# Patient Record
Sex: Female | Born: 1950 | Race: Black or African American | Hispanic: No | Marital: Single | Smoking: Never smoker
Health system: Southern US, Community
[De-identification: ages and names within clinical notes are randomized; demographics above are authoritative.]

## PROBLEM LIST (undated history)

## (undated) DIAGNOSIS — I5189 Other ill-defined heart diseases: Secondary | ICD-10-CM

## (undated) DIAGNOSIS — I471 Supraventricular tachycardia, unspecified: Secondary | ICD-10-CM

## (undated) DIAGNOSIS — M1990 Unspecified osteoarthritis, unspecified site: Secondary | ICD-10-CM

## (undated) DIAGNOSIS — I10 Essential (primary) hypertension: Secondary | ICD-10-CM

## (undated) HISTORY — PX: ABDOMINAL HYSTERECTOMY: SHX81

## (undated) HISTORY — DX: Other ill-defined heart diseases: I51.89

## (undated) HISTORY — DX: Supraventricular tachycardia: I47.1

## (undated) HISTORY — DX: Supraventricular tachycardia, unspecified: I47.10

---

## 2009-01-06 ENCOUNTER — Observation Stay: Payer: Self-pay | Admitting: Internal Medicine

## 2009-01-06 IMAGING — US US CAROTID DUPLEX BILAT
1 series · 17 of 24 positions shown · non-contrast
Comparison: none

REASON FOR EXAM: presyncope
COMMENTS:

[Series 1: us carotid duplex bilat · 17 of 70 slices shown]
[im 1/70]
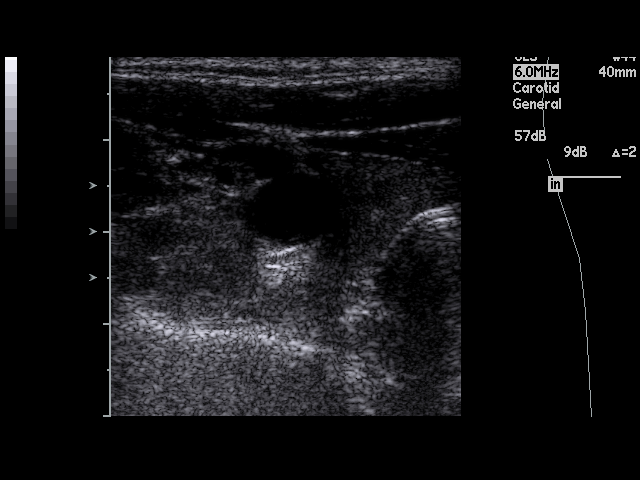
[im 7/70]
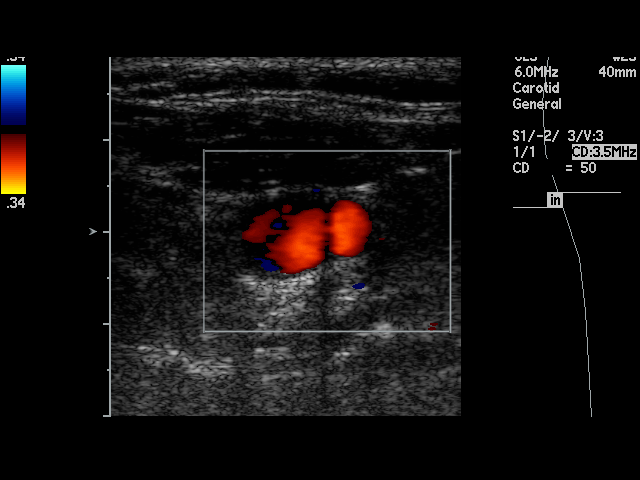
[im 10/70]
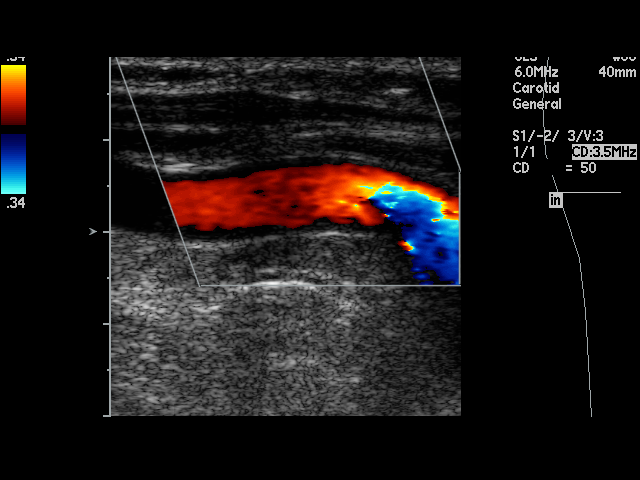
[im 13/70]
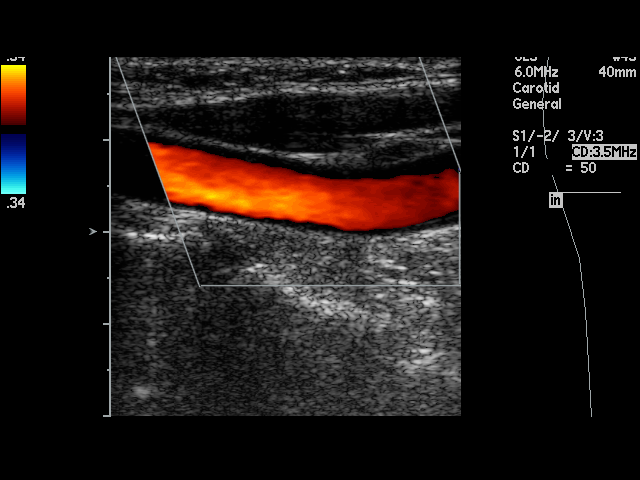
[im 19/70]
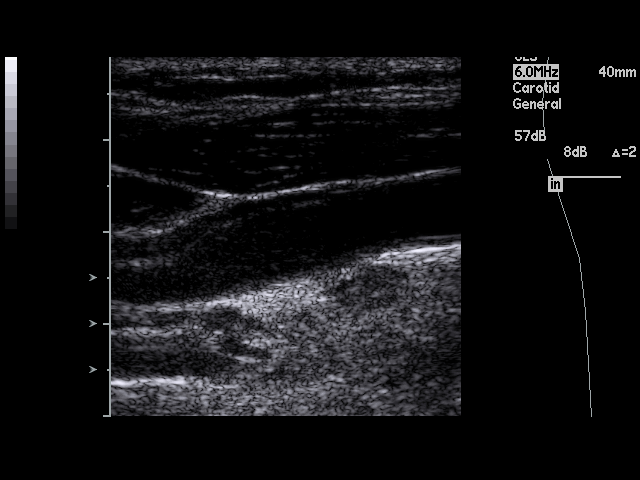
[im 22/70]
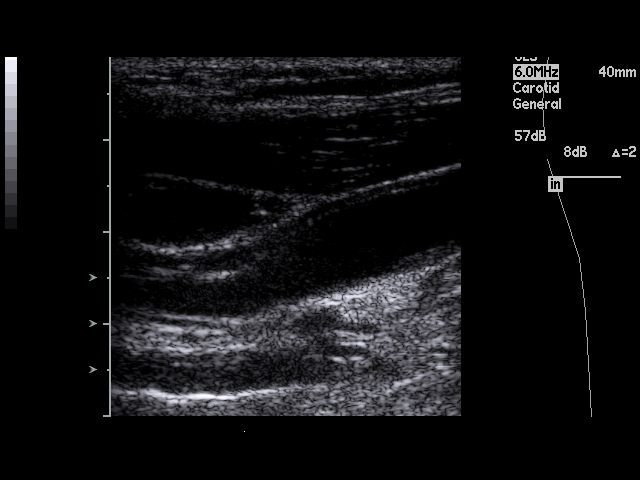
[im 28/70]
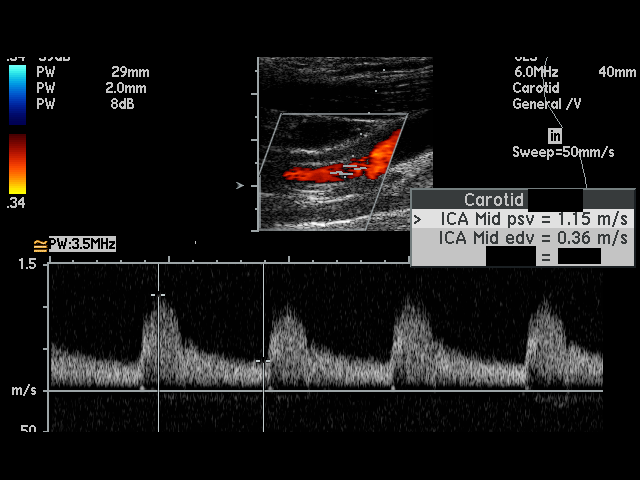
[im 31/70]
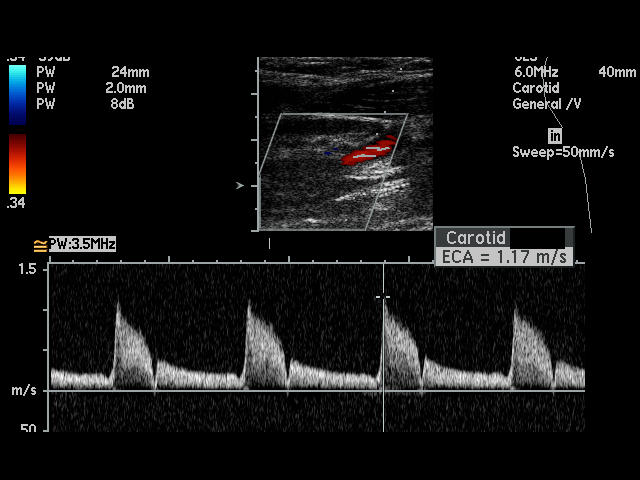
[im 37/70]
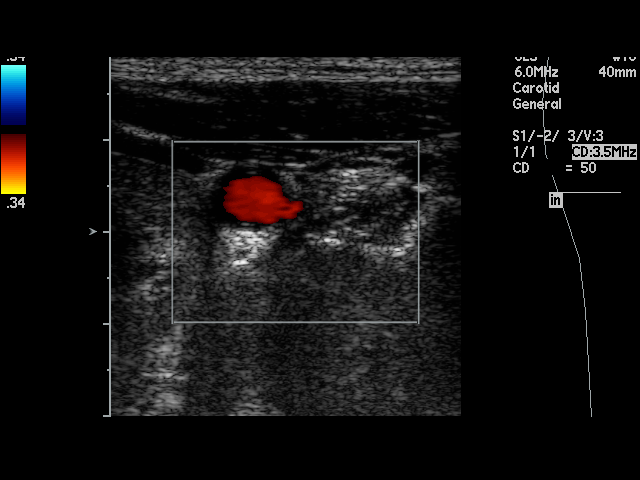
[im 40/70]
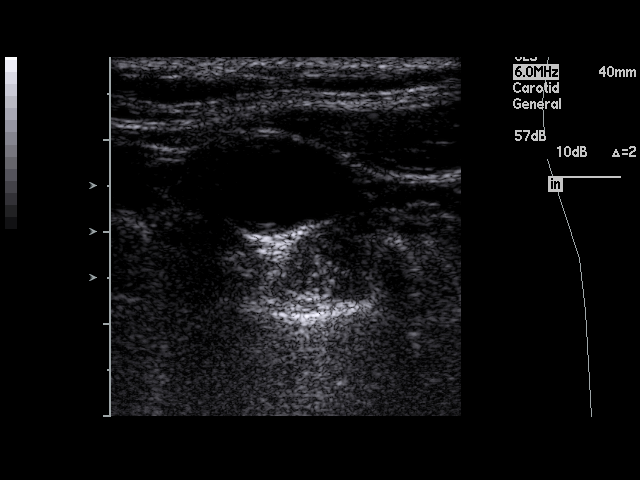
[im 43/70]
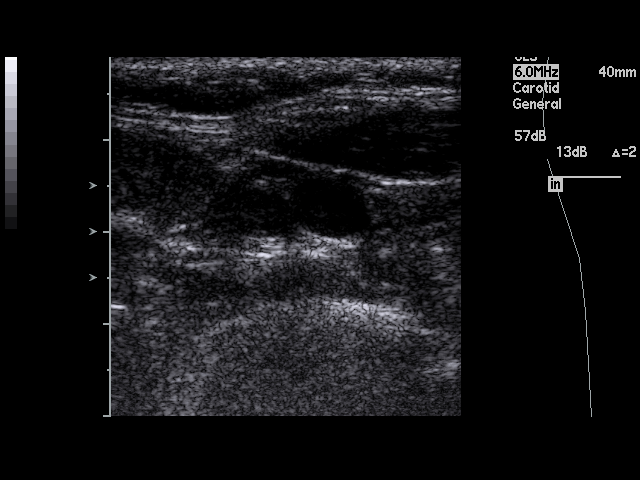
[im 49/70]
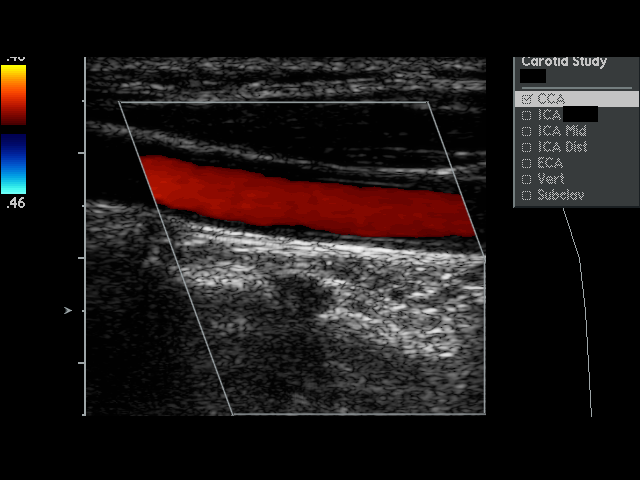
[im 52/70]
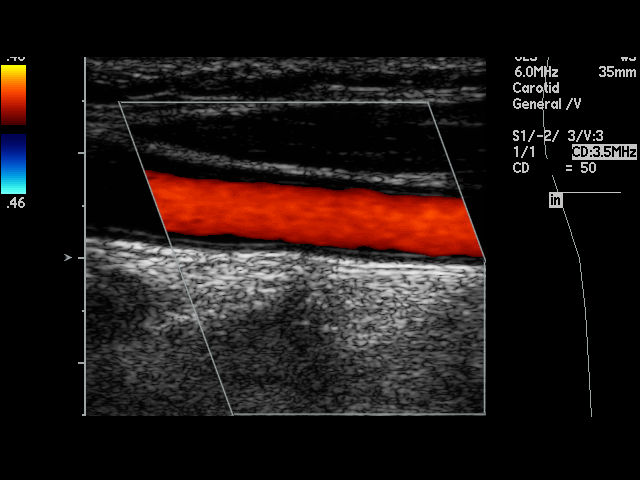
[im 58/70]
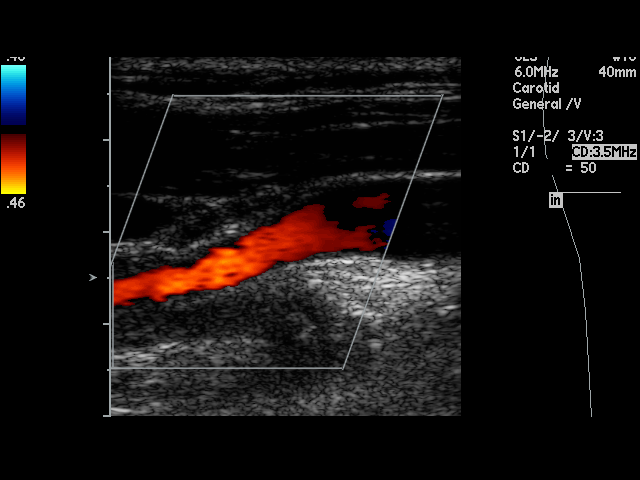
[im 61/70]
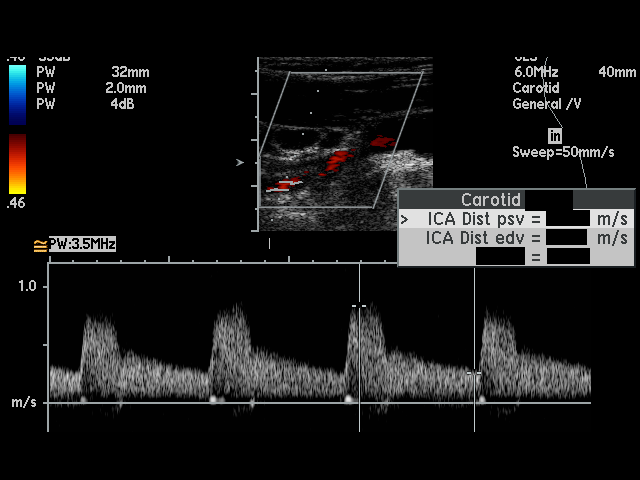
[im 64/70]
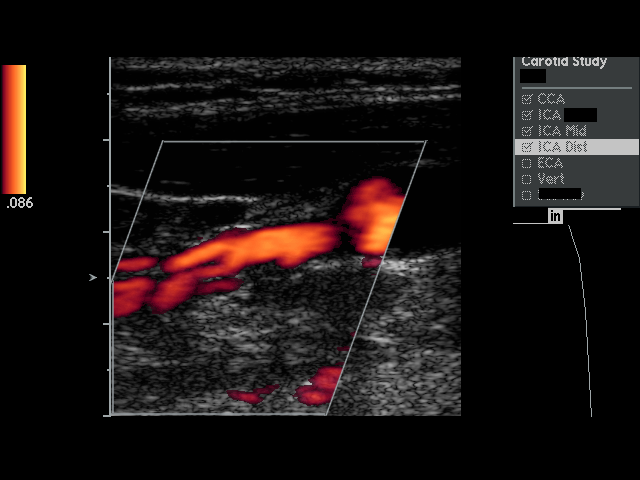
[im 70/70]
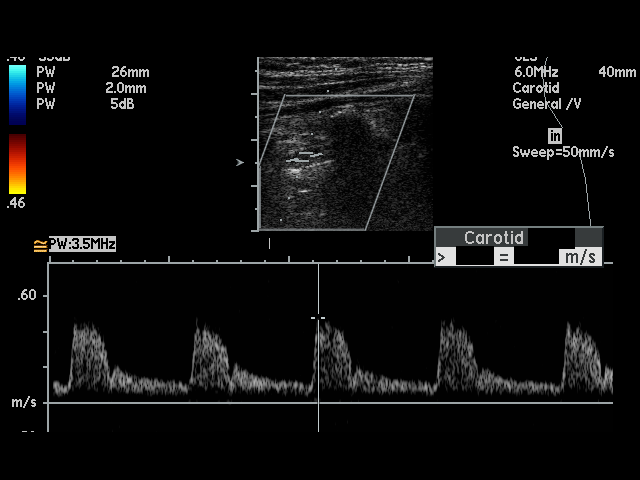

[17 of 24 positions shown; findings below may reference images not displayed]

PROCEDURE:     US  - US CAROTID DOPPLER BILATERAL  - [DATE] [DATE]

RESULT:     Grayscale, duplex color flow and spectral waveform imaging was
performed of the right and left carotid systems.

Visual evaluation of the right carotid system demonstrates smooth symmetric
plaque within the carotid bulb demonstrating less than 50% stenosis. No
further evidence of plaque formation is identified within the right or left
carotid systems. Spectral waveform imaging and color flow is unremarkable
within the right and left carotid systems.

Antegrade flow is identified within the right and left vertebral arteries.

ICA:CCA ratios:

RIGHT:

LEFT:
IMPRESSION: No sonographic evidence of hemodynamically significant
stenosis within the right or left carotid systems.

## 2009-01-06 IMAGING — CR DG CHEST 2V
1 series · 2 of 2 positions shown · non-contrast
Comparison: none

REASON FOR EXAM: chest pain
COMMENTS:

PROCEDURE:     DXR - DXR CHEST PA (OR AP) AND LATERAL  - [DATE]  [DATE]
RESULT:     The lungs are clear. The cardiac silhouette and visualized bony
skeleton are unremarkable.

[Series 1: view not recorded · 0.17mm/px · 2 of 2 slices shown]
[im 1/2]
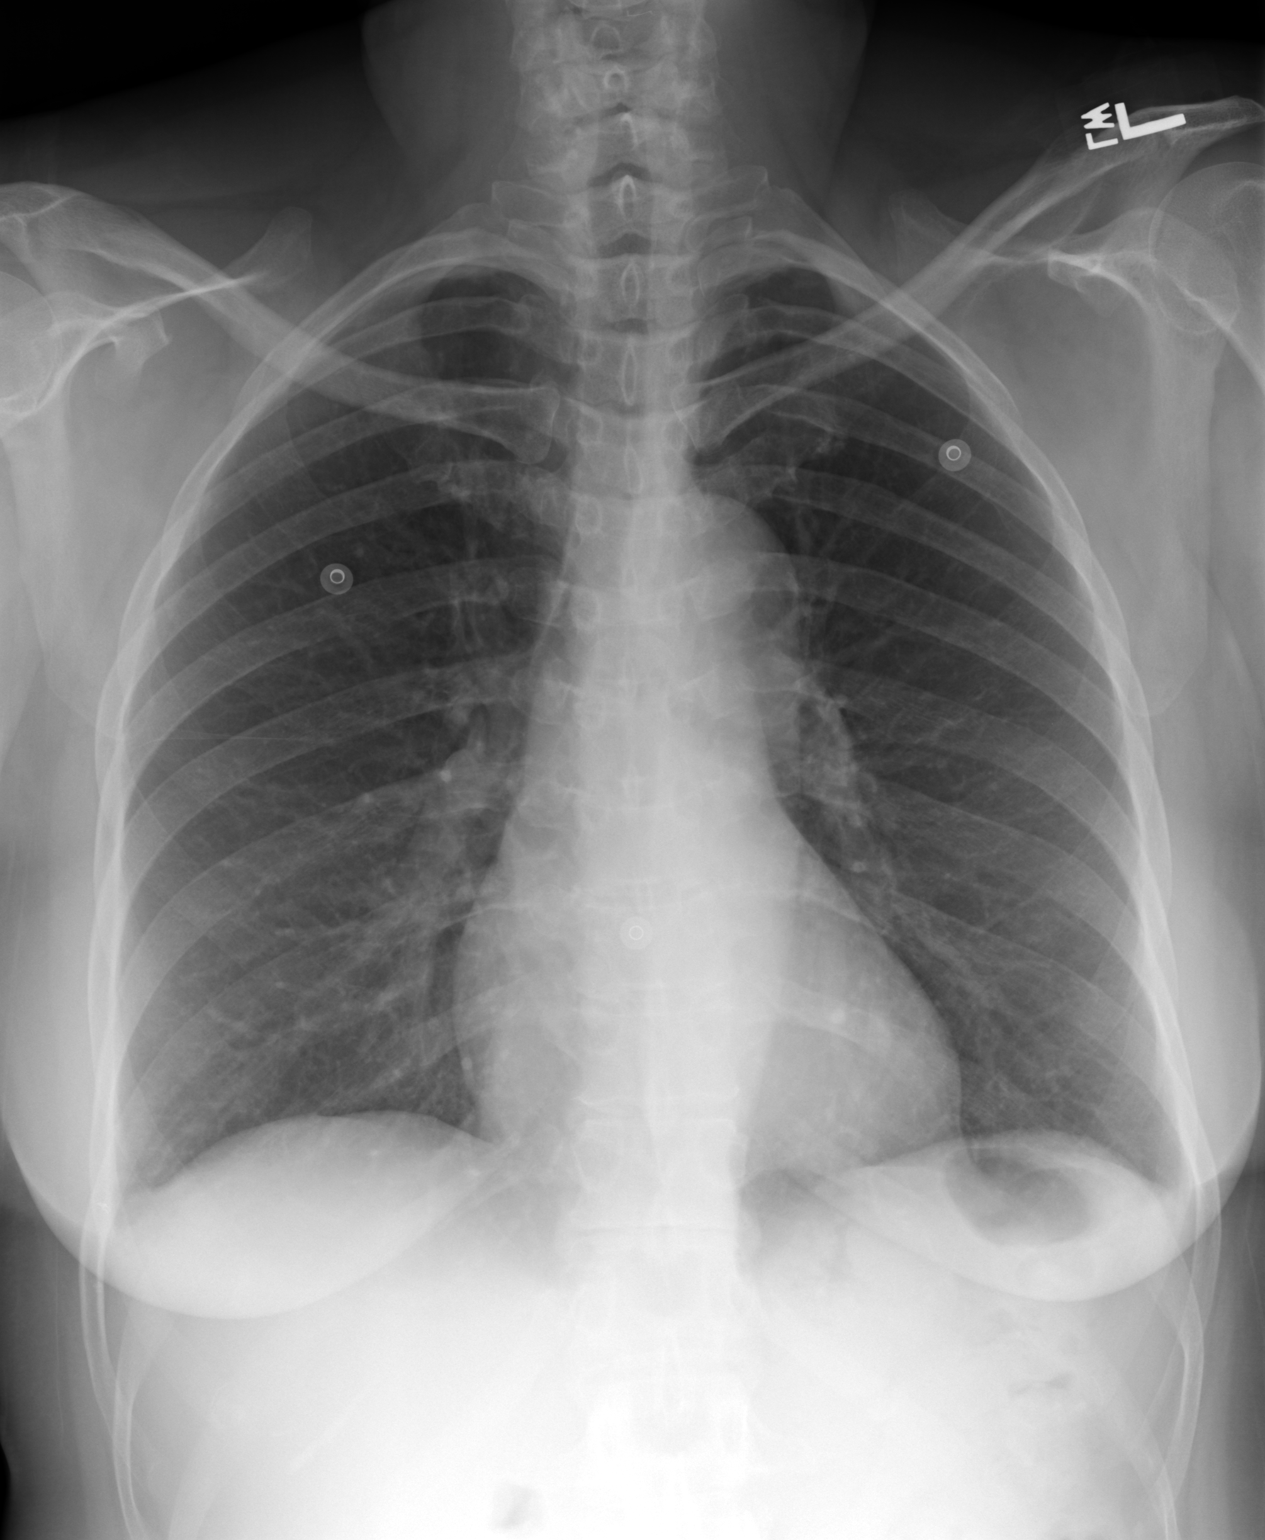
[im 2/2]
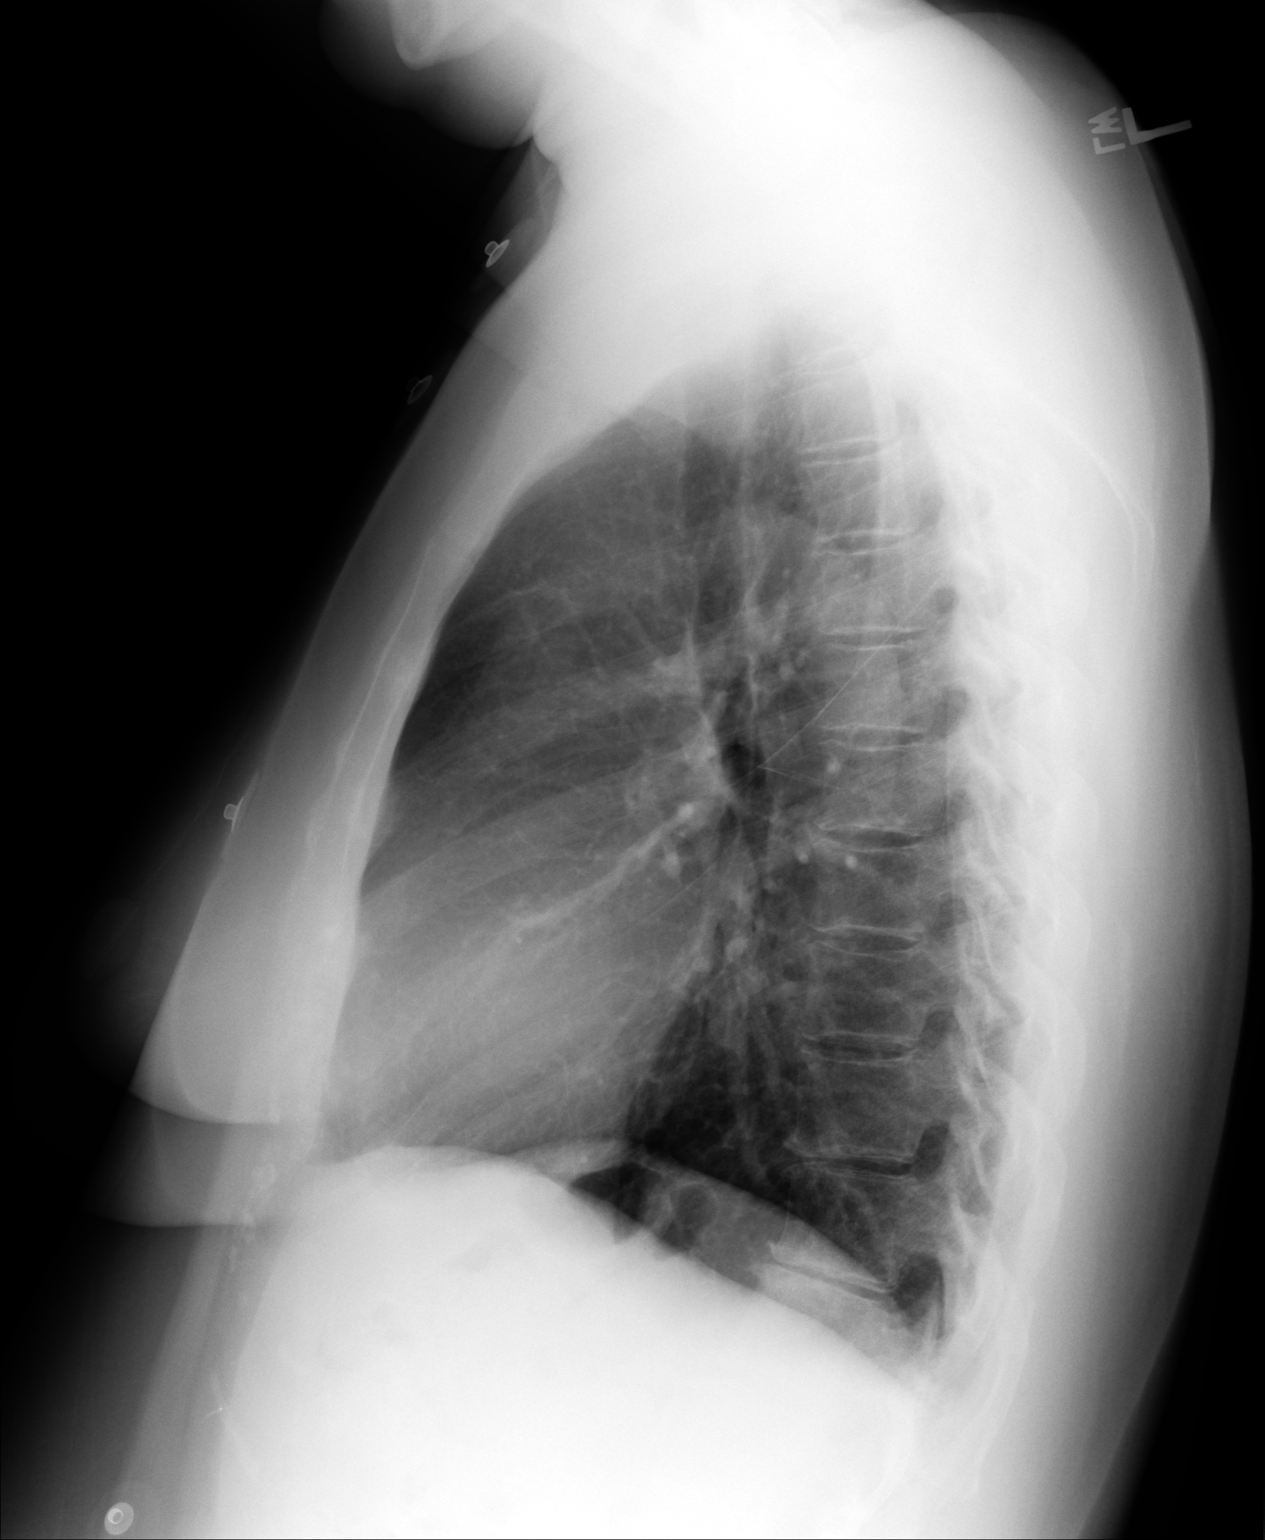

[2 of 2 positions shown; findings below may reference images not displayed]

IMPRESSION: 1. Chest radiograph without evidence of acute cardiopulmonary disease.

## 2009-01-06 IMAGING — CT CT HEAD WITHOUT CONTRAST
2 series · 16 of 30 positions shown, 20 images · non-contrast
Comparison: none

REASON FOR EXAM: dizziness
COMMENTS:

[Series 2: without · axial · non-contrast · 0.41mm/px · z∈[+432,+552]mm · 13 of 30 slices shown, 17 images]
[im 3/30  brain]
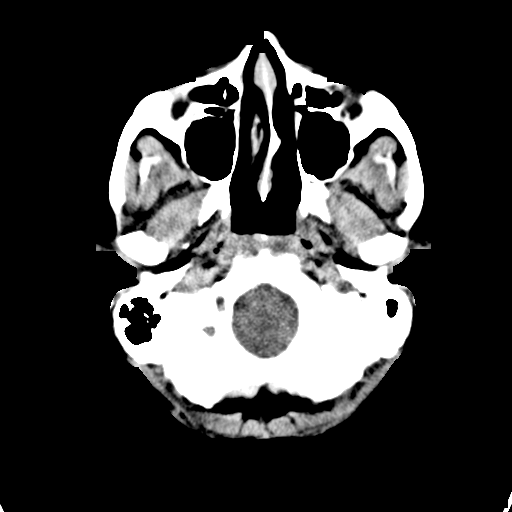
[im 3/30  bone]
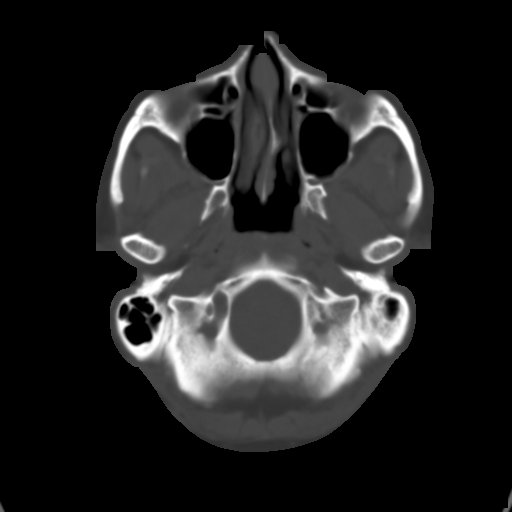
[im 5/30  brain]
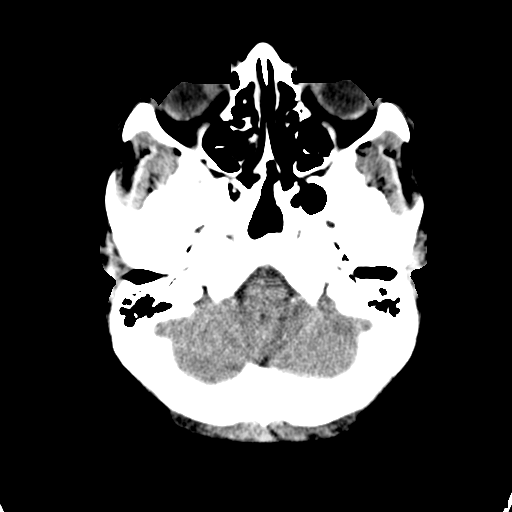
[im 7/30  brain]
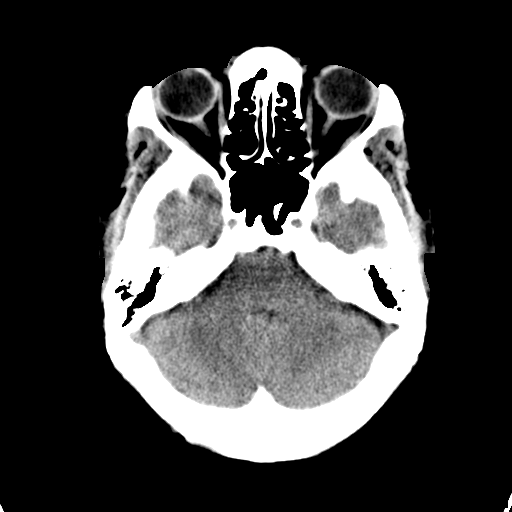
[im 9/30  brain]
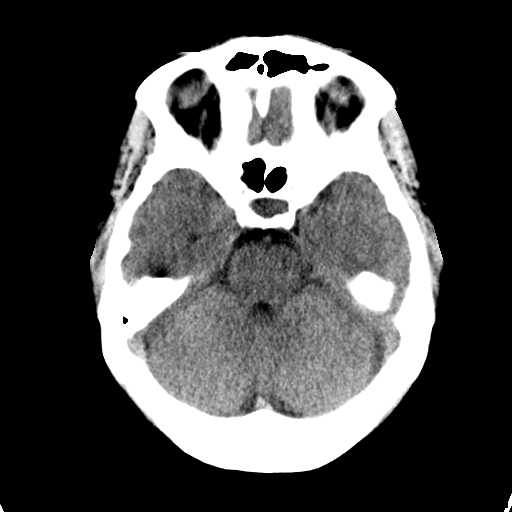
[im 11/30  brain]
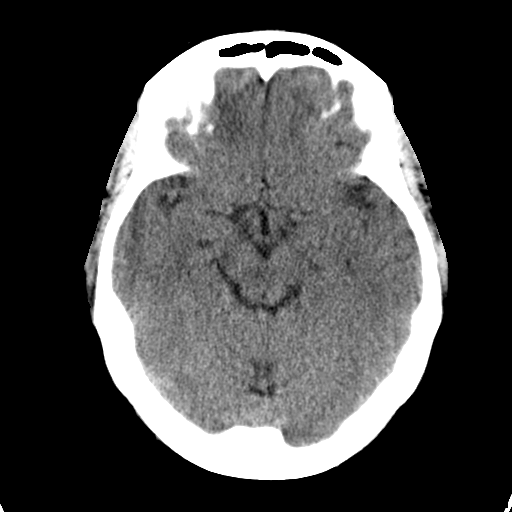
[im 11/30  bone]
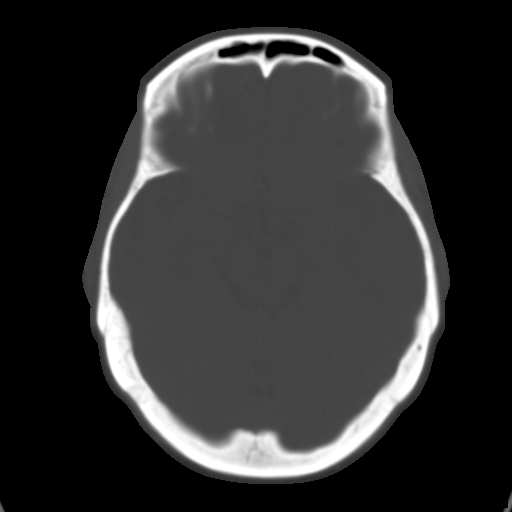
[im 13/30  brain]
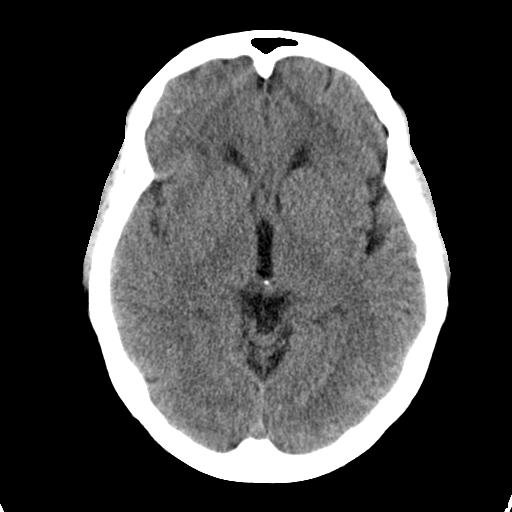
[im 15/30  brain]
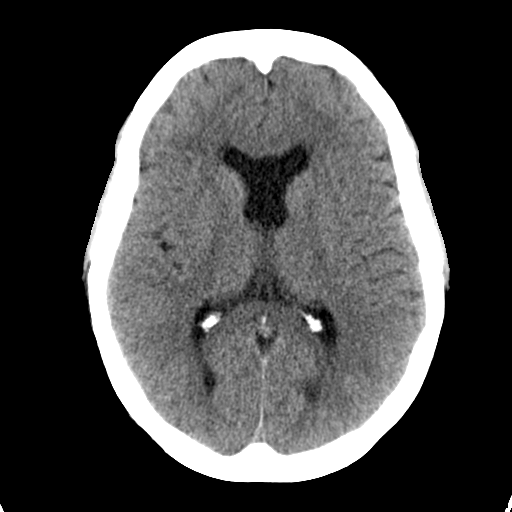
[im 17/30  brain]
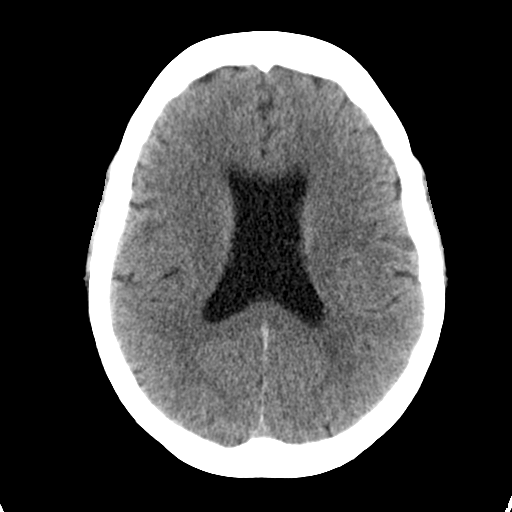
[im 19/30  brain]
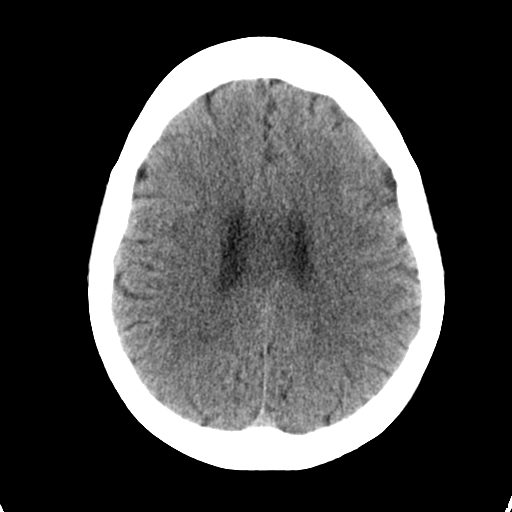
[im 19/30  bone]
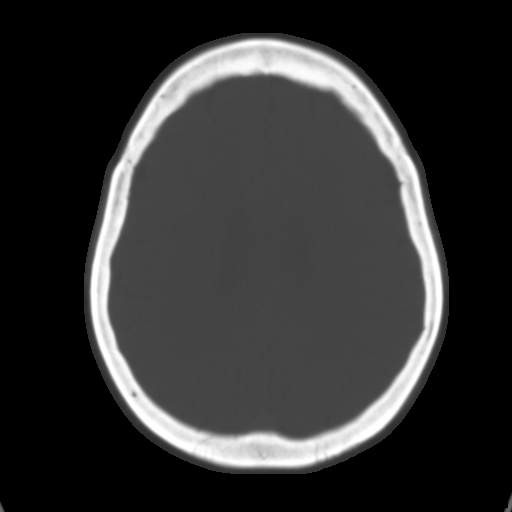
[im 21/30  brain]
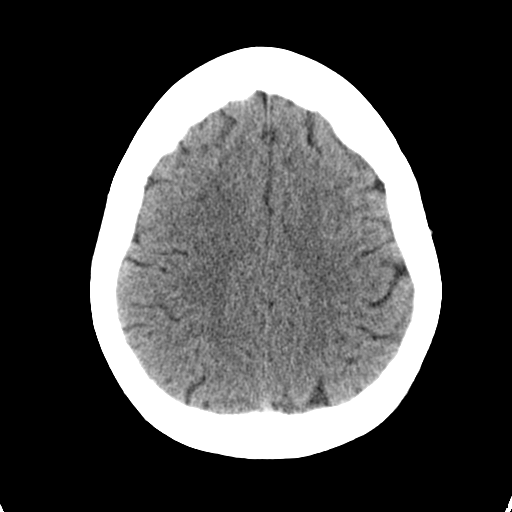
[im 23/30  brain]
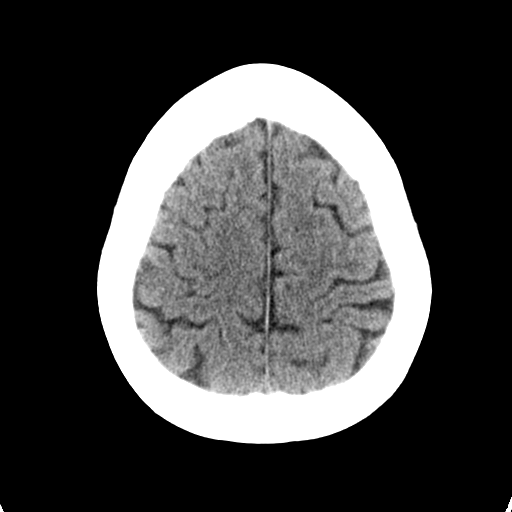
[im 25/30  brain]
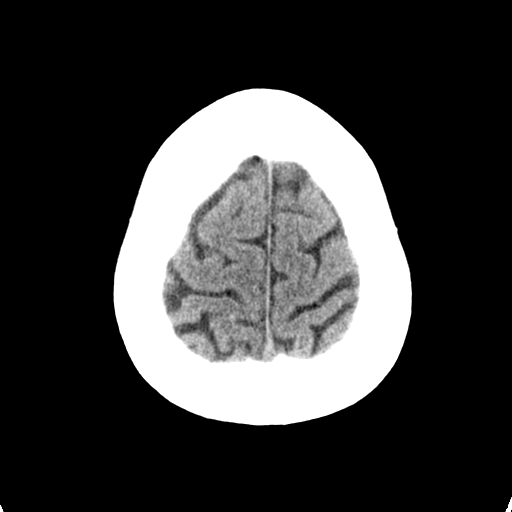
[im 27/30  brain]
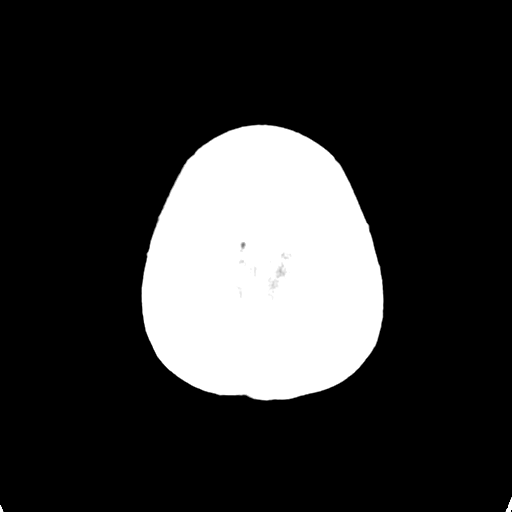
[im 27/30  bone]
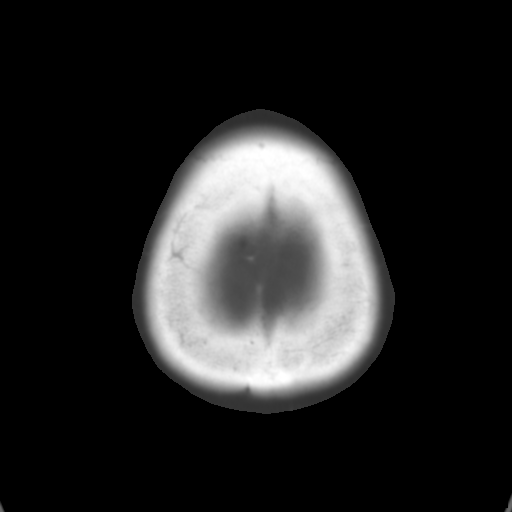

[Series 3: bone · axial · 0.41mm/px · z∈[+432,+472]mm · 3 of 30 slices shown]
[im 3/30  bone]
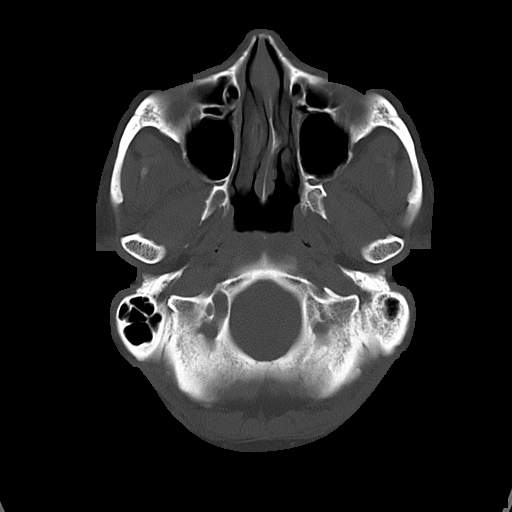
[im 7/30  bone]
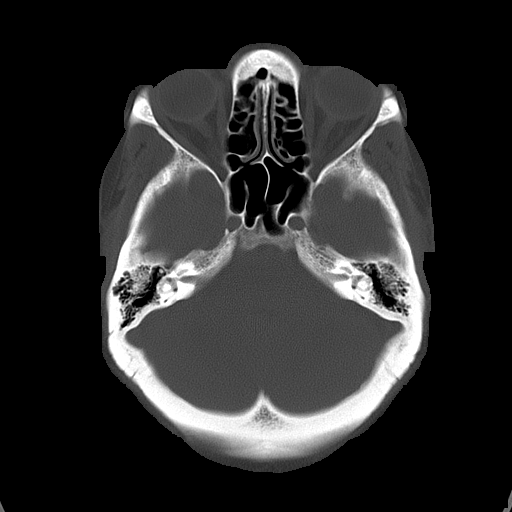
[im 11/30  bone]
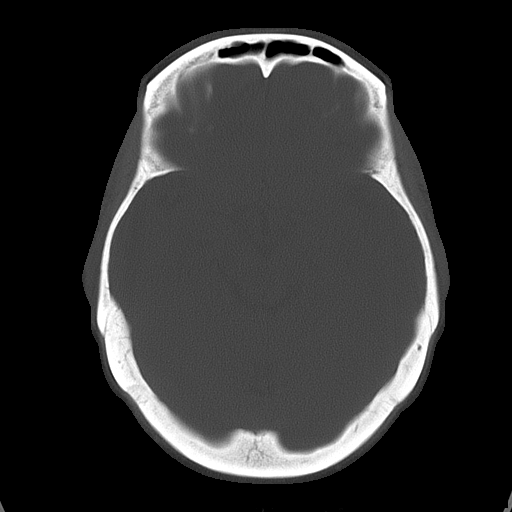

[16 of 30 positions shown; findings below may reference images not displayed]

PROCEDURE:     CT  - CT HEAD WITHOUT CONTRAST  - [DATE]  [DATE]

RESULT:     Axial noncontrast CT scanning was performed through the brain at
5 mm intervals and slice thicknesses.

The ventricles are normal in size and position. There is no intracranial
hemorrhage nor intracranial mass effect. There is no evidence of an acute
evolving ischemic infarction. At bone window settings I do not see evidence
of an acute skull fracture.
IMPRESSION: I see no acute intracranial abnormality.

A preliminary report was sent to the [HOSPITAL] the conclusion
of the study.

## 2013-12-20 ENCOUNTER — Emergency Department (HOSPITAL_COMMUNITY): Payer: Medicare Other

## 2013-12-20 ENCOUNTER — Encounter (HOSPITAL_COMMUNITY): Payer: Self-pay | Admitting: Emergency Medicine

## 2013-12-20 ENCOUNTER — Emergency Department (HOSPITAL_COMMUNITY)
Admission: EM | Admit: 2013-12-20 | Discharge: 2013-12-20 | Disposition: A | Discharge status: Home / Self Care | Payer: Medicare Other | Attending: Emergency Medicine | Admitting: Emergency Medicine

## 2013-12-20 DIAGNOSIS — R002 Palpitations: Secondary | ICD-10-CM | POA: Insufficient documentation

## 2013-12-20 DIAGNOSIS — R0602 Shortness of breath: Secondary | ICD-10-CM | POA: Insufficient documentation

## 2013-12-20 DIAGNOSIS — Z79899 Other long term (current) drug therapy: Secondary | ICD-10-CM | POA: Diagnosis not present

## 2013-12-20 DIAGNOSIS — Z792 Long term (current) use of antibiotics: Secondary | ICD-10-CM | POA: Insufficient documentation

## 2013-12-20 DIAGNOSIS — I10 Essential (primary) hypertension: Secondary | ICD-10-CM | POA: Diagnosis not present

## 2013-12-20 HISTORY — DX: Essential (primary) hypertension: I10

## 2013-12-20 LAB — PRO B NATRIURETIC PEPTIDE: Pro B Natriuretic peptide (BNP): 20.9 pg/mL (ref 0–125)

## 2013-12-20 LAB — BASIC METABOLIC PANEL
ANION GAP: 13 (ref 5–15)
BUN: 21 mg/dL (ref 6–23)
CHLORIDE: 99 meq/L (ref 96–112)
CO2: 25 mEq/L (ref 19–32)
Calcium: 9.7 mg/dL (ref 8.4–10.5)
Creatinine, Ser: 0.78 mg/dL (ref 0.50–1.10)
GFR calc non Af Amer: 86 mL/min — ABNORMAL LOW (ref >90)
Glucose, Bld: 159 mg/dL — ABNORMAL HIGH (ref 70–99)
Potassium: 4.1 mEq/L (ref 3.7–5.3)
Sodium: 137 mEq/L (ref 137–147)

## 2013-12-20 LAB — I-STAT TROPONIN, ED: Troponin i, poc: 0.01 ng/mL (ref 0.00–0.08)

## 2013-12-20 LAB — CBC
HCT: 41.8 % (ref 36.0–46.0)
Hemoglobin: 13.5 g/dL (ref 12.0–15.0)
MCH: 22.6 pg — AB (ref 26.0–34.0)
MCHC: 32.3 g/dL (ref 30.0–36.0)
MCV: 70 fL — ABNORMAL LOW (ref 78.0–100.0)
PLATELETS: 288 10*3/uL (ref 150–400)
RBC: 5.97 MIL/uL — ABNORMAL HIGH (ref 3.87–5.11)
RDW: 15.5 % (ref 11.5–15.5)
WBC: 9.8 10*3/uL (ref 4.0–10.5)

## 2013-12-20 LAB — D-DIMER, QUANTITATIVE: D-Dimer, Quant: <0.27 ug/mL-FEU (ref 0.00–0.48)

## 2013-12-20 IMAGING — CR DG CHEST 2V
2 series · 2 of 2 positions shown · non-contrast
Comparison: None.

CLINICAL DATA: Palpitations and shortness of breath.

EXAM:
CHEST  2 VIEW

[w chest pa]
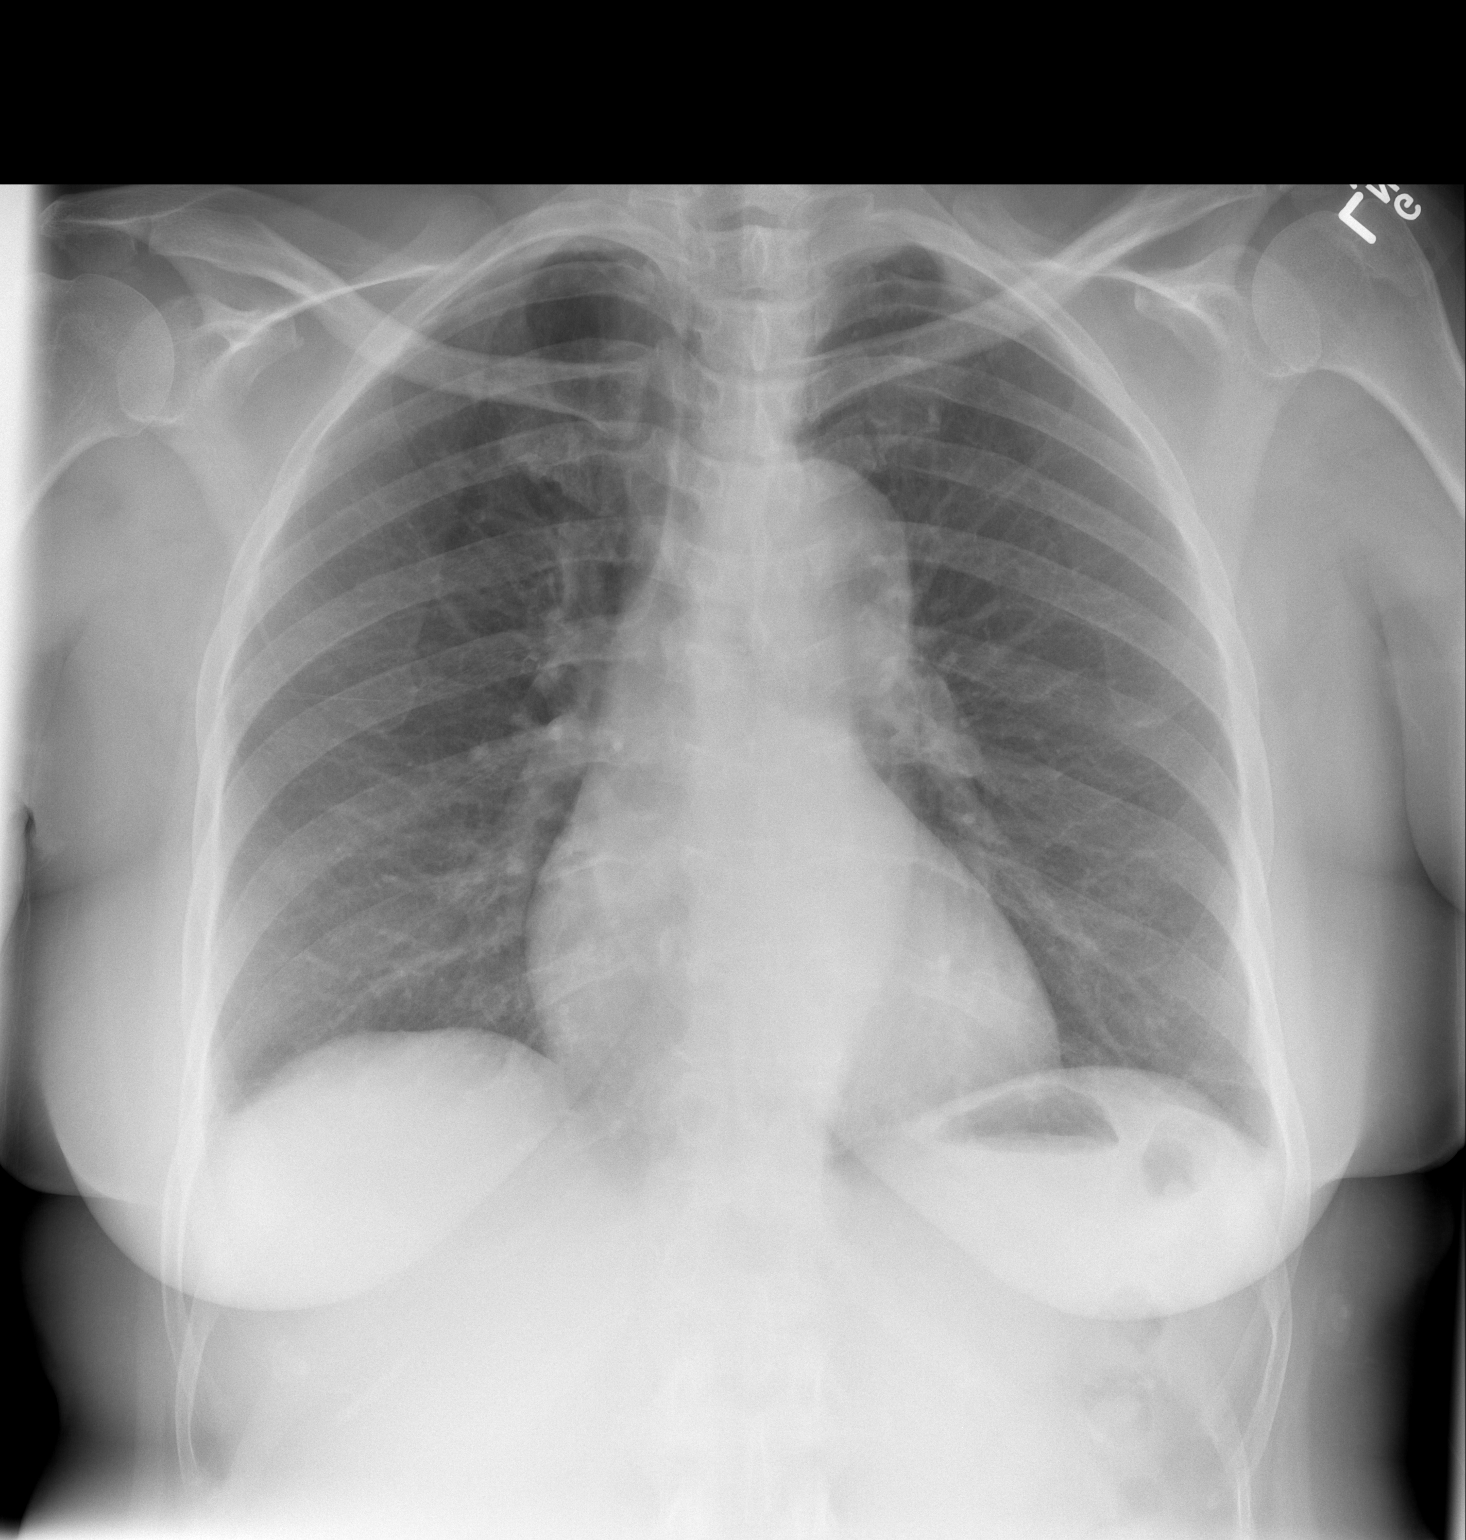

[w chest lat]
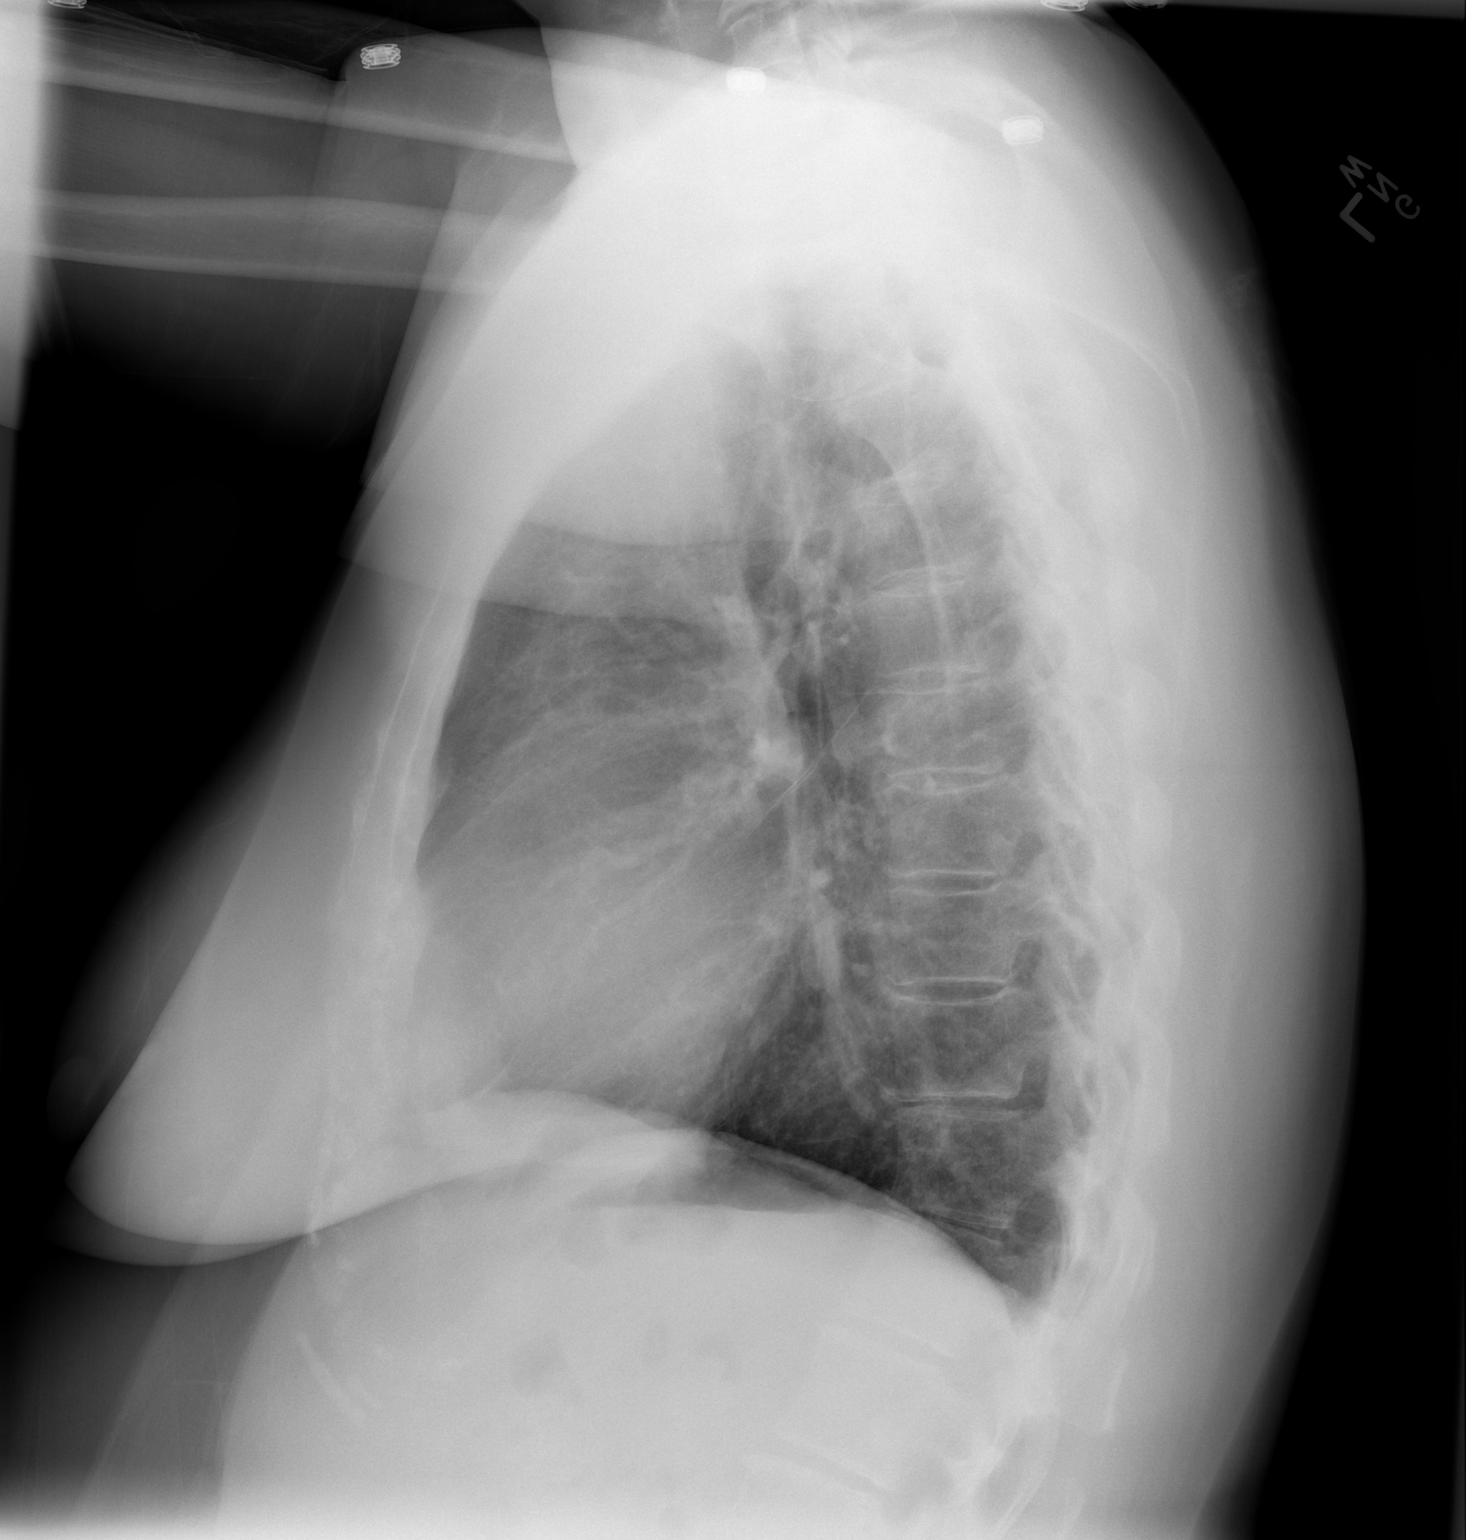

[2 of 2 positions shown; findings below may reference images not displayed]

FINDINGS: The lungs are well-aerated and clear. There is no evidence of focal
opacification, pleural effusion or pneumothorax.

The heart is normal in size; the mediastinal contour is within
normal limits. No acute osseous abnormalities are seen.
IMPRESSION: No acute cardiopulmonary process seen.

## 2013-12-20 MED ORDER — PANTOPRAZOLE SODIUM 20 MG PO TBEC: 20.0000 mg | DELAYED_RELEASE_TABLET | Freq: Two times a day (BID) | ORAL | Status: DC

## 2013-12-20 NOTE — ED Notes (Signed)
Patient arrives with complaint of "heart racing", "feeling like it was going to beat through my chest", and feeling short of breath. States that the symptoms began about 45 mins ago and lasted approximately 20 min. States previous history of the same, but it has never lasted that long. Reports only 1 previous incident this year. Reports not major cardiac problems in the past. In triage HR found to be 80-85. Patient states symptoms are currently resolved, but she still feels nervous.

## 2013-12-20 NOTE — Discharge Instructions (Signed)

## 2013-12-21 NOTE — ED Provider Notes (Addendum)
CSN: [id number]     Arrival date & time 12/20/13  2030 History   First MD Initiated Contact with Patient 12/20/13 2124     Chief Complaint  Patient presents with  . Palpitations  . Shortness of Breath     (Consider location/radiation/quality/duration/timing/severity/associated sxs/prior Treatment) Patient is a 63 y.o. female presenting with palpitations and shortness of breath. The history is provided by the patient.  Palpitations Palpitations quality:  Regular Onset quality:  Gradual Timing:  Constant Progression:  Resolved Chronicity:  Recurrent Context: not caffeine, not exercise, not hyperventilation, not illicit drugs and not nicotine   Relieved by:  Nothing Worsened by:  Nothing tried Associated symptoms: chest pain (burning in her chest)   Associated symptoms: no cough, no diaphoresis, no leg pain, no lower extremity edema, no nausea, no shortness of breath, no vomiting and no weakness   Risk factors: no hx of atrial fibrillation, no hx of PE, no hypercoagulable state and no stress   Shortness of Breath Associated symptoms: chest pain (burning in her chest)   Associated symptoms: no abdominal pain, no cough, no diaphoresis, no fever and no vomiting     Past Medical History  Diagnosis Date  . Hypertension    Past Surgical History  Procedure Laterality Date  . Abdominal hysterectomy     History reviewed. No pertinent family history. History  Substance Use Topics  . Smoking status: Never Smoker   . Smokeless tobacco: Not on file  . Alcohol Use: No   OB History   Grav Para Term Preterm Abortions TAB SAB Ect Mult Living                 Review of Systems  Constitutional: Negative for fever, chills and diaphoresis.  Respiratory: Negative for cough and shortness of breath.   Cardiovascular: Positive for chest pain (burning in her chest) and palpitations. Negative for leg swelling.  Gastrointestinal: Negative for nausea, vomiting and abdominal pain.  All other  systems reviewed and are negative.     Allergies  Review of patient's allergies indicates no known allergies.  Home Medications   Prior to Admission medications   Medication Sig Start Date End Date Taking? Authorizing Provider  doxycycline (VIBRA-TABS) 100 MG tablet Take 100 mg by mouth 2 (two) times daily. For 10 days. Starting 12/16/13. 12/16/13  Yes Historical Provider, MD  estrogens, conjugated, (PREMARIN) 0.625 MG tablet Take 0.625 mg by mouth every other day.   Yes Historical Provider, MD  lisinopril (PRINIVIL,ZESTRIL) 20 MG tablet Take 20 mg by mouth daily. 10/19/13  Yes Historical Provider, MD  pantoprazole (PROTONIX) 20 MG tablet Take 1 tablet (20 mg total) by mouth 2 (two) times daily. 12/20/13   Elwin Mocha, MD   BP 140/85  Pulse 66  Temp(Src) 98.8 F (37.1 C) (Oral)  Resp 21  SpO2 100% Physical Exam  Nursing note and vitals reviewed. Constitutional: She is oriented to person, place, and time. She appears well-developed and well-nourished. No distress.  HENT:  Head: Normocephalic and atraumatic.  Mouth/Throat: Oropharynx is clear and moist.  Eyes: EOM are normal. Pupils are equal, round, and reactive to light.  Neck: Normal range of motion. Neck supple.  Cardiovascular: Normal rate and regular rhythm.  Exam reveals no friction rub.   No murmur heard. Pulmonary/Chest: Effort normal and breath sounds normal. No respiratory distress. She has no wheezes. She has no rales.  Abdominal: Soft. She exhibits no distension. There is no tenderness. There is no rebound.  Musculoskeletal: Normal range  of motion. She exhibits no edema.  Neurological: She is alert and oriented to person, place, and time. No cranial nerve deficit. She exhibits normal muscle tone. Coordination normal.  Skin: No rash noted. She is not diaphoretic.    ED Course  Procedures (including critical care time) Labs Review Labs Reviewed  CBC - Abnormal; Notable for the following:    RBC 5.97 (*)    MCV 70.0  (*)    MCH 22.6 (*)    All other components within normal limits  BASIC METABOLIC PANEL - Abnormal; Notable for the following:    Glucose, Bld 159 (*)    GFR calc non Af Amer 86 (*)    All other components within normal limits  PRO B NATRIURETIC PEPTIDE  D-DIMER, QUANTITATIVE  I-STAT TROPOININ, ED    Imaging Review Dg Chest 2 View  12/20/2013   CLINICAL DATA:  Palpitations and shortness of breath.  EXAM: CHEST  2 VIEW  COMPARISON:  None.  FINDINGS: The lungs are well-aerated and clear. There is no evidence of focal opacification, pleural effusion or pneumothorax.  The heart is normal in size; the mediastinal contour is within normal limits. No acute osseous abnormalities are seen.  IMPRESSION: No acute cardiopulmonary process seen.   Electronically Signed   By: Roanna Raider M.D.   On: 12/20/2013 22:05     EKG Interpretation   Date/Time:  Saturday December 20 2013 20:32:26 EDT Ventricular Rate:  83 PR Interval:  152 QRS Duration: 100 QT Interval:  372 QTC Calculation: 437 R Axis:   -57 Text Interpretation:  Normal sinus rhythm Possible Left atrial enlargement  Incomplete right bundle branch block Left anterior fascicular block Left  ventricular hypertrophy Nonspecific ST abnormality Abnormal ECG No prior  Confirmed by Gwendolyn Grant  MD, Nehan Flaum ([id number]) on 12/21/2013 12:23:52 AM      MDM   Final diagnoses:  Palpitations    63 year old female had episode of heart racing and some burning in her chest. Associated shortness of breath. She had a 20 minute episode tonight. She also had an episode earlier in the week and 13 weeks ago. Denies any chest pain, dizziness at that time. Here vitals stable; exam benign. With some burning in her chest, will treat with PPI. Labs were all okay. She is on Premarin and is older, d-dimer is negative, no concern for PE as a cause of her palpitations. She is also complaining of some right posterior knee pain without any appreciable swelling, doubt DVT with  negative dimer. Instructed to followup with her regular doctor for a Holter monitor.    Elwin Mocha, MD 12/21/13 1610  Elwin Mocha, MD 12/21/13 [phone]

## 2014-08-16 ENCOUNTER — Encounter (HOSPITAL_COMMUNITY): Payer: Self-pay | Admitting: Emergency Medicine

## 2014-08-16 ENCOUNTER — Emergency Department (HOSPITAL_COMMUNITY)
Admission: EM | Admit: 2014-08-16 | Discharge: 2014-08-16 | Disposition: A | Discharge status: Home / Self Care | Payer: Medicare Other | Attending: Emergency Medicine | Admitting: Emergency Medicine

## 2014-08-16 DIAGNOSIS — Z79899 Other long term (current) drug therapy: Secondary | ICD-10-CM | POA: Diagnosis not present

## 2014-08-16 DIAGNOSIS — I10 Essential (primary) hypertension: Secondary | ICD-10-CM | POA: Diagnosis not present

## 2014-08-16 DIAGNOSIS — E871 Hypo-osmolality and hyponatremia: Secondary | ICD-10-CM | POA: Diagnosis not present

## 2014-08-16 DIAGNOSIS — R000 Tachycardia, unspecified: Secondary | ICD-10-CM | POA: Diagnosis present

## 2014-08-16 DIAGNOSIS — I471 Supraventricular tachycardia: Secondary | ICD-10-CM | POA: Diagnosis not present

## 2014-08-16 LAB — CBC WITH DIFFERENTIAL/PLATELET
BASOS ABS: 0 10*3/uL (ref 0.0–0.1)
Basophils Relative: 0 % (ref 0–1)
Eosinophils Absolute: 0.1 10*3/uL (ref 0.0–0.7)
Eosinophils Relative: 1 % (ref 0–5)
HCT: 44.2 % (ref 36.0–46.0)
HEMOGLOBIN: 14.5 g/dL (ref 12.0–15.0)
LYMPHS ABS: 3.4 10*3/uL (ref 0.7–4.0)
LYMPHS PCT: 31 % (ref 12–46)
MCH: 23.4 pg — AB (ref 26.0–34.0)
MCHC: 32.8 g/dL (ref 30.0–36.0)
MCV: 71.3 fL — ABNORMAL LOW (ref 78.0–100.0)
Monocytes Absolute: 0.8 10*3/uL (ref 0.1–1.0)
Monocytes Relative: 8 % (ref 3–12)
NEUTROS PCT: 60 % (ref 43–77)
Neutro Abs: 6.6 10*3/uL (ref 1.7–7.7)
PLATELETS: 325 10*3/uL (ref 150–400)
RBC: 6.2 MIL/uL — ABNORMAL HIGH (ref 3.87–5.11)
RDW: 15.3 % (ref 11.5–15.5)
WBC: 10.9 10*3/uL — ABNORMAL HIGH (ref 4.0–10.5)

## 2014-08-16 LAB — BASIC METABOLIC PANEL
Anion gap: 8 (ref 5–15)
BUN: 15 mg/dL (ref 6–20)
CALCIUM: 9.6 mg/dL (ref 8.9–10.3)
CO2: 25 mmol/L (ref 22–32)
Chloride: 103 mmol/L (ref 101–111)
Creatinine, Ser: 0.83 mg/dL (ref 0.44–1.00)
GFR calc Af Amer: >60 mL/min (ref >60)
GLUCOSE: 107 mg/dL — AB (ref 70–99)
Potassium: 4.3 mmol/L (ref 3.5–5.1)
Sodium: 136 mmol/L (ref 135–145)

## 2014-08-16 LAB — TSH: TSH: 0.622 u[IU]/mL (ref 0.350–4.500)

## 2014-08-16 MED ORDER — METOPROLOL TARTRATE 25 MG PO TABS: 25.0000 mg | ORAL_TABLET | ORAL | Status: DC | PRN

## 2014-08-16 MED ORDER — METOPROLOL TARTRATE 25 MG PO TABS
25.0000 mg | ORAL_TABLET | Freq: Once | ORAL | Status: AC
  Administered 2014-08-16: 25 mg via ORAL
  Filled 2014-08-16: qty 1

## 2014-08-16 NOTE — ED Notes (Signed)
Pt was in SVT with a rate in the 180's performed vagal maneuver and her HR lowered to the 110's.

## 2014-08-16 NOTE — ED Notes (Signed)
Pt reports that she began feeling her heart racing at 11:45 when she was cooking. Pt alert x4. NAD at this time. Pt has hx of the same.

## 2014-08-16 NOTE — Discharge Instructions (Signed)
Please continue taking your blood pressure medicine - lisinopril - you MUST follow up for a holter monitor test - this will help the cardiologist know if your symptoms are something that needs to be fixed or just managed with medications.  If you have palpitations that don't resolve after "bearing down" or after taking the medicine for 30 or more minutes - return to the ER immediately.  Your testing was normal - you will need to have your doctor to follow up - the thyroid test will be available in 24 hours - your doctor will need to follow these results.    Please call your doctor for a followup appointment within 24-48 hours. When you talk to your doctor please let them know that you were seen in the emergency department and have them acquire all of your records so that they can discuss the findings with you and formulate a treatment plan to fully care for your new and ongoing problems.

## 2014-08-16 NOTE — ED Provider Notes (Signed)
CSN: [id number]     Arrival date & time 08/16/14  1408 History   First MD Initiated Contact with Patient 08/16/14 1426     Chief Complaint  Patient presents with  . Tachycardia     (Consider location/radiation/quality/duration/timing/severity/associated sxs/prior Treatment) HPI Comments: The patient is a 64 year old female, she presents to the hospital with an abnormal heart rate stating that at approximately 1145 she developed acute onset of palpitations will she was cooking dinner, she has had this multiple times in the past and states that it usually goes away when she climbs the stairs, she did have to go to the hospital and was told she needed a cardiac monitor or Holter monitor test as an outpatient but did not follow up to have this performed. She has been doing well from last year until now, her symptoms have been ongoing and persistent for approximately 3 hours, nothing makes this better or worse, no associated swelling of the legs, weight loss, diarrhea, neck pain, fever, chills, no alcohol ingestion, no tobacco use, no stimulant use though she has been using medications for allergies over the last week.  The history is provided by the patient.    Past Medical History  Diagnosis Date  . Hypertension    Past Surgical History  Procedure Laterality Date  . Abdominal hysterectomy     No family history on file. History  Substance Use Topics  . Smoking status: Never Smoker   . Smokeless tobacco: Not on file  . Alcohol Use: No   OB History    No data available     Review of Systems  All other systems reviewed and are negative.     Allergies  Review of patient's allergies indicates no known allergies.  Home Medications   Prior to Admission medications   Medication Sig Start Date End Date Taking? Authorizing Provider  estrogens, conjugated, (PREMARIN) 0.625 MG tablet Take 0.625 mg by mouth every other day.   Yes Historical Provider, MD  ibuprofen (ADVIL,MOTRIN) 200 MG  tablet Take 200 mg by mouth daily as needed (pain).   Yes Historical Provider, MD  lisinopril (PRINIVIL,ZESTRIL) 20 MG tablet Take 20 mg by mouth daily. 10/19/13  Yes Historical Provider, MD  loratadine (CLARITIN) 10 MG tablet Take 10 mg by mouth at bedtime.   Yes Historical Provider, MD  oxymetazoline (AFRIN) 0.05 % nasal spray Place 1 spray into both nostrils at bedtime as needed for congestion.   Yes Historical Provider, MD  metoprolol (LOPRESSOR) 25 MG tablet Take 1 tablet (25 mg total) by mouth as needed (palpitations). 08/16/14   Eber HongBrian Mayer Vondrak, MD  pantoprazole (PROTONIX) 20 MG tablet Take 1 tablet (20 mg total) by mouth 2 (two) times daily. Patient not taking: Reported on 08/16/2014 12/20/13   Elwin MochaBlair Walden, MD   BP 129/89 mmHg  Pulse 63  Temp(Src) 98.2 F (36.8 C) (Oral)  Resp 15  Ht 5\' 10"  (1.778 m)  Wt 223 lb 14.4 oz (101.56 kg)  BMI 32.13 kg/m2  SpO2 95% Physical Exam  Constitutional: She appears well-developed and well-nourished. No distress.  HENT:  Head: Normocephalic and atraumatic.  Mouth/Throat: Oropharynx is clear and moist. No oropharyngeal exudate.  Eyes: Conjunctivae and EOM are normal. Pupils are equal, round, and reactive to light. Right eye exhibits no discharge. Left eye exhibits no discharge. No scleral icterus.  Neck: Normal range of motion. Neck supple. No JVD present. No thyromegaly present.  Cardiovascular: Regular rhythm, normal heart sounds and intact distal pulses.  Exam reveals  no gallop and no friction rub.   No murmur heard. Tachycardic to 190 bpm, regular, thready pulses  Pulmonary/Chest: Effort normal and breath sounds normal. No respiratory distress. She has no wheezes. She has no rales.  Abdominal: Soft. Bowel sounds are normal. She exhibits no distension and no mass. There is no tenderness.  Musculoskeletal: Normal range of motion. She exhibits no edema or tenderness.  Lymphadenopathy:    She has no cervical adenopathy.  Neurological: She is alert.  Coordination normal.  Skin: Skin is warm and dry. No rash noted. No erythema.  Psychiatric: She has a normal mood and affect. Her behavior is normal.  Nursing note and vitals reviewed.   ED Course  Procedures (including critical care time) Labs Review Labs Reviewed  CBC WITH DIFFERENTIAL/PLATELET - Abnormal; Notable for the following:    WBC 10.9 (*)    RBC 6.20 (*)    MCV 71.3 (*)    MCH 23.4 (*)    All other components within normal limits  BASIC METABOLIC PANEL - Abnormal; Notable for the following:    Sodium 128 (*)    Chloride 99 (*)    CO2 20 (*)    Glucose, Bld 146 (*)    All other components within normal limits  BASIC METABOLIC PANEL - Abnormal; Notable for the following:    Glucose, Bld 107 (*)    All other components within normal limits  TSH    Imaging Review No results found.   EKG Interpretation   Date/Time:  Sunday Aug 16 2014 14:14:22 EDT Ventricular Rate:  189 PR Interval:    QRS Duration: 98 QT Interval:  274 QTC Calculation: 485 R Axis:   -66 Text Interpretation:  Supraventricular tachycardia Left axis deviation  Incomplete right bundle branch block Left ventricular hypertrophy with  repolarization abnormality Abnormal ECG Since last tracing  Supraventricular tachycardia NOW PRESENT Confirmed by Hyacinth Meeker  MD, Sorcha Rotunno  [phone]) on 08/16/2014 3:05:41 PM      EKG Interpretation  Date/Time:  Sunday Aug 16 2014 14:23:59 EDT Ventricular Rate:  96 PR Interval:  154 QRS Duration: 107 QT Interval:  387 QTC Calculation: 489 R Axis:   -60 Text Interpretation:  Sinus rhythm Probable left atrial enlargement Left anterior fascicular block Abnormal R-wave progression, early transition Left ventricular hypertrophy REPOLARIZATION ABNORMALITY Since last tracing Supraventricular tachycardia RESOLVED SINCE PREVIOUS Confirmed by Hyacinth Meeker  MD, Ariam Mol ([id number]) on 08/16/2014 3:06:36 PM        MDM   Final diagnoses:  SVT (supraventricular tachycardia)  Hyponatremia     The patient appears mildly anxious, severely tachycardic, vagal maneuvers were performed immediately and the patient had almost immediate resolution of the SVT, repeat EKG shows a normal sinus rhythm with a left anterior fascicular block and signs of left ventricular hypertrophy. The patient's daughter is now here and states that the mother does not take her medications consistently including her antihypertensives, she is also worried that she may be a borderline diabetic. The patient is feeling much better after conversion to a normal rhythm, we'll give a dose of oral metoprolol and likely start this as an outpatient. She is currently on lisinopril, a beta blocker would be more appropriate to help control SVT. We'll also recommend cardiology follow-up.  Pt has remained in NSR after BB - BP is currently 127/74, she has no sx - she is feeling well, I have counseled her on OTC med use as well as use of the BB as pill in the pocket.  She  has expressed her understanding to the indications for return.  She will f/u on her TSH as outpt.  No CP and no SOB.    Meds given in ED:  Medications  metoprolol tartrate (LOPRESSOR) tablet 25 mg (25 mg Oral Given 08/16/14 1454)    Discharge Medication List as of 08/16/2014  4:10 PM    START taking these medications   Details  metoprolol (LOPRESSOR) 25 MG tablet Take 1 tablet (25 mg total) by mouth as needed (palpitations)., Starting 08/16/2014, Until Discontinued, Print          Eber HongBrian Fujie Dickison, MD 08/17/14 [phone]

## 2014-08-17 LAB — BASIC METABOLIC PANEL
Anion gap: 9 (ref 5–15)
BUN: 19 mg/dL (ref 6–20)
CO2: 20 mmol/L — ABNORMAL LOW (ref 22–32)
CREATININE: 0.89 mg/dL (ref 0.44–1.00)
Calcium: 9.1 mg/dL (ref 8.9–10.3)
Chloride: 99 mmol/L — ABNORMAL LOW (ref 101–111)
GFR calc Af Amer: >60 mL/min (ref >60)
Glucose, Bld: 146 mg/dL — ABNORMAL HIGH (ref 70–99)
Sodium: 128 mmol/L — ABNORMAL LOW (ref 135–145)

## 2014-09-10 ENCOUNTER — Ambulatory Visit (INDEPENDENT_AMBULATORY_CARE_PROVIDER_SITE_OTHER): Payer: Medicare Other | Admitting: Cardiology

## 2014-09-10 ENCOUNTER — Encounter: Payer: Self-pay | Admitting: Cardiology

## 2014-09-10 VITALS — BP 170/80 | HR 68 | Ht 70.0 in | Wt 225.1 lb

## 2014-09-10 DIAGNOSIS — I10 Essential (primary) hypertension: Secondary | ICD-10-CM | POA: Diagnosis not present

## 2014-09-10 DIAGNOSIS — I471 Supraventricular tachycardia: Secondary | ICD-10-CM

## 2014-09-10 MED ORDER — METOPROLOL SUCCINATE ER 25 MG PO TB24: ORAL_TABLET | ORAL | Status: DC

## 2014-09-10 NOTE — Progress Notes (Signed)
Cardiology Office Note   Date:  09/10/2014   ID:  Jordan Barron, DOB July 07, 1950, MRN [id number]  PCP:  No PCP Per Patient  Cardiologist: Cassell Clement MD  Chief Complaint  Patient presents with  . Appointment    new pt, SVT       History of Present Illness: Jordan Barron is a 64 y.o. female who presents for cardiology evaluation. The patient was seen in the emergency room on 08/16/14 for supraventricular tachycardia.  Her initial EKG at 1414 shows supraventricular tachycardia at a rate of 189.  A follow-up EKG at 1423 shows normal sinus rhythm at 96 bpm.  She has left anterior hemiblock. The patient does not have any prior history of known cardiac problems.  She reports that in September 2015 she had a similar episode of tachycardia.  With the tachycardia she develops shortness of breath and a pressure-like sensation in her chest.  She has never had syncope.  After her last emergency room visit she was given metoprolol to have on hand but has not taken any.  She does have a history of high blood pressure and is on lisinopril 20 mg daily.  She is followed at [facility] June at urgent care.  The patient had a chest x-ray in September 2015 which showed normal heart size and clear lungs.  She has never had an echocardiogram. Her social history reveals that she was born in [place].  Her daughter was born in [place].  She works part time at [employer]. She does not smoke or drink. Her family history reveals that her mother has a pacemaker.  Her father died in the war in [place].   Past Medical History  Diagnosis Date  . Hypertension     Past Surgical History  Procedure Laterality Date  . Abdominal hysterectomy       Current Outpatient Prescriptions  Medication Sig Dispense Refill  . estrogens, conjugated, (PREMARIN) 0.625 MG tablet Take 0.625 mg by mouth every other day.    . ibuprofen (ADVIL,MOTRIN) 200 MG tablet Take 200 mg by mouth daily as needed (pain).    Marland Kitchen  lisinopril (PRINIVIL,ZESTRIL) 20 MG tablet Take 20 mg by mouth daily.    Marland Kitchen loratadine (CLARITIN) 10 MG tablet Take 10 mg by mouth at bedtime.    . metoprolol (LOPRESSOR) 25 MG tablet Take 1 tablet (25 mg total) by mouth as needed (palpitations). 30 tablet 0  . Multiple Vitamin (MULTIVITAMIN) capsule Take 1 capsule by mouth daily.    Marland Kitchen oxymetazoline (AFRIN) 0.05 % nasal spray Place 1 spray into both nostrils at bedtime as needed for congestion.    . ranitidine (ZANTAC) 75 MG tablet Take 75 mg by mouth daily as needed for heartburn.    . metoprolol succinate (TOPROL XL) 25 MG 24 hr tablet 1/2 tablet by mouth daily 15 tablet 5   No current facility-administered medications for this visit.    Allergies:   Review of patient's allergies indicates no known allergies.    Social History:  The patient  reports that she has never smoked. She does not have any smokeless tobacco history on file. She reports that she does not drink alcohol or use illicit drugs.   Family History:  The patient's family history includes Other in her mother.    ROS:  Please see the history of present illness.   Otherwise, review of systems are positive for none.   All other systems are reviewed and negative.  He does have  a history of hot flashes from menopause and takes as vision replacement therapy every other day   PHYSICAL EXAM: VS:  BP 170/80 mmHg  Pulse 68  Ht 5\' 10"  (1.778 m)  Wt 225 lb 1.9 oz (102.114 kg)  BMI 32.30 kg/m2 , BMI Body mass index is 32.3 kg/(m^2). GEN: Well nourished, well developed, in no acute distress HEENT: normal Neck: no JVD, carotid bruits, or masses Cardiac: RRR; no murmurs, rubs, or gallops,no edema  Respiratory:  clear to auscultation bilaterally, normal work of breathing GI: soft, nontender, nondistended, + BS MS: no deformity or atrophy Skin: warm and dry, no rash Neuro:  Strength and sensation are intact Psych: euthymic mood, full affect   EKG:  EKG is ordered today. The ekg  ordered today demonstrates sinus rhythm.  Left anterior hemiblock.  Voltage for LVH.  Since previous tracing of 08/16/14, heart rate is slower   Recent Labs: 12/20/2013: Pro B Natriuretic peptide (BNP) 20.9 08/16/2014: BUN 15; Creatinine 0.83; Hemoglobin 14.5; Platelets 325; Potassium 4.3; Sodium 136; TSH 0.622    Lipid Panel No results found for: CHOL, TRIG, HDL, CHOLHDL, VLDL, LDLCALC, LDLDIRECT    Wt Readings from Last 3 Encounters:  09/10/14 225 lb 1.9 oz (102.114 kg)  08/16/14 223 lb 14.4 oz (101.56 kg)      Other studies Reviewed: Additional studies/ records that were reviewed today include: Emergency room visit from 08/16/14. Review of the above records demonstrates: Initial EKG confirming supraventricular tachycardia.   ASSESSMENT AND PLAN:  1.  Paroxysmal supraventricular tachycardia 2.  History of essential hypertension on lisinopril 3.  Postmenopausal state on as vision replacement therapy  Disposition: We are going to have her start taking Toprol-XL 12.5 mg 1 daily.  We will have her return for a Holter monitor.  If the Holter monitor shows nothing, we will extended to a 30 day monitor.  She will return for a 2-D echo.  Recheck in 2 months for office visit and EKG.   Current medicines are reviewed at length with the patient today.  The patient does not have concerns regarding medicines.  The following changes have been made:  no change  Labs/ tests ordered today include:   Orders Placed This Encounter  Procedures  . Cardiac event monitor  . EKG 12-Lead  . ECHOCARDIOGRAM COMPLETE     Signed, Cassell Clementhomas Wilgus Deyton MD 09/10/2014 1:16 PM    [facility] [address], [place], [place]  [address] Phone: [phone]; Fax: [phone]

## 2014-09-10 NOTE — Patient Instructions (Signed)
Medication Instructions:  START TOPROL XL (METOPROLOL SUC) 25 MG 1/2 TABLET DAILY  Labwork: NONE  Testing/Procedures: Your physician has requested that you have an echocardiogram. Echocardiography is a painless test that uses sound waves to create images of your heart. It provides your doctor with information about the size and shape of your heart and how well your heart's chambers and valves are working. This procedure takes approximately one hour. There are no restrictions for this procedure.  Your physician has recommended that you wear an event monitor. Event monitors are medical devices that record the heart's electrical activity. Doctors most often us these monitors to diagnose arrhythmias. Arrhythmias are problems with the speed or rhythm of the heartbeat. The monitor is a small, portable device. You can wear one while you do your normal daily activities. This is usually used to diagnose what is causing palpitations/syncope (passing out). 30 DAY   Follow-Up: Your physician recommends that you schedule a follow-up appointment in: 2 MONTH OV/EKG

## 2014-09-17 ENCOUNTER — Other Ambulatory Visit: Payer: Self-pay

## 2014-09-17 ENCOUNTER — Ambulatory Visit (HOSPITAL_COMMUNITY): Payer: Medicare Other | Attending: Internal Medicine

## 2014-09-17 DIAGNOSIS — I10 Essential (primary) hypertension: Secondary | ICD-10-CM | POA: Diagnosis not present

## 2014-09-17 DIAGNOSIS — I471 Supraventricular tachycardia: Secondary | ICD-10-CM | POA: Insufficient documentation

## 2014-12-03 ENCOUNTER — Ambulatory Visit: Payer: Medicare Other | Admitting: Cardiology

## 2015-05-10 ENCOUNTER — Encounter (HOSPITAL_COMMUNITY): Payer: Self-pay | Admitting: Emergency Medicine

## 2015-05-10 ENCOUNTER — Emergency Department (HOSPITAL_COMMUNITY)
Admission: EM | Admit: 2015-05-10 | Discharge: 2015-05-11 | Disposition: A | Discharge status: Home / Self Care | Payer: Medicare Other | Attending: Emergency Medicine | Admitting: Emergency Medicine

## 2015-05-10 ENCOUNTER — Emergency Department (HOSPITAL_COMMUNITY): Payer: Medicare Other

## 2015-05-10 DIAGNOSIS — R519 Headache, unspecified: Secondary | ICD-10-CM

## 2015-05-10 DIAGNOSIS — R51 Headache: Secondary | ICD-10-CM | POA: Diagnosis not present

## 2015-05-10 DIAGNOSIS — Z793 Long term (current) use of hormonal contraceptives: Secondary | ICD-10-CM | POA: Insufficient documentation

## 2015-05-10 DIAGNOSIS — Z79899 Other long term (current) drug therapy: Secondary | ICD-10-CM | POA: Insufficient documentation

## 2015-05-10 DIAGNOSIS — I10 Essential (primary) hypertension: Secondary | ICD-10-CM | POA: Diagnosis not present

## 2015-05-10 LAB — BASIC METABOLIC PANEL
Anion gap: 13 (ref 5–15)
BUN: 13 mg/dL (ref 6–20)
CHLORIDE: 102 mmol/L (ref 101–111)
CO2: 26 mmol/L (ref 22–32)
CREATININE: 0.65 mg/dL (ref 0.44–1.00)
Calcium: 9.8 mg/dL (ref 8.9–10.3)
Glucose, Bld: 141 mg/dL — ABNORMAL HIGH (ref 65–99)
POTASSIUM: 4.3 mmol/L (ref 3.5–5.1)
Sodium: 141 mmol/L (ref 135–145)

## 2015-05-10 LAB — CBC
HEMATOCRIT: 40.7 % (ref 36.0–46.0)
Hemoglobin: 12.9 g/dL (ref 12.0–15.0)
MCH: 22.6 pg — ABNORMAL LOW (ref 26.0–34.0)
MCHC: 31.7 g/dL (ref 30.0–36.0)
MCV: 71.3 fL — AB (ref 78.0–100.0)
PLATELETS: 311 10*3/uL (ref 150–400)
RBC: 5.71 MIL/uL — AB (ref 3.87–5.11)
RDW: 15.5 % (ref 11.5–15.5)
WBC: 8.2 10*3/uL (ref 4.0–10.5)

## 2015-05-10 IMAGING — CT CT HEAD W/O CM
2 series · 15 of 30 positions shown, 17 images · non-contrast
Comparison: [DATE]

CLINICAL DATA: Headache for 24 hours.  Nausea and vomiting.

EXAM:
CT HEAD WITHOUT CONTRAST
TECHNIQUE: Contiguous axial images were obtained from the base of the skull
through the vertex without intravenous contrast.

[Series 2: head without · axial · non-contrast · 0.42mm/px · z∈[-136,-21]mm · 7 of 31 slices shown, 9 images]
[im 4/31  brain]
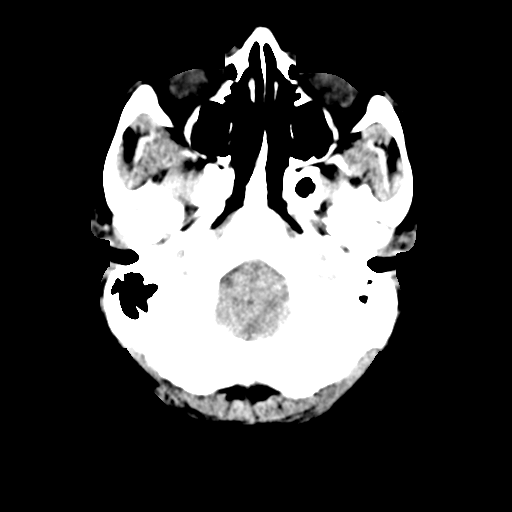
[im 4/31  bone]
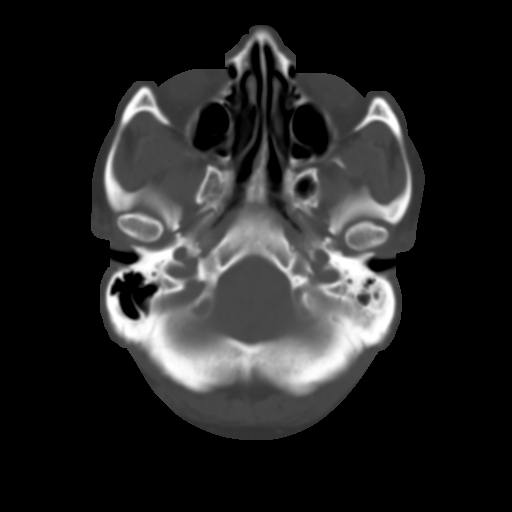
[im 8/31  brain]
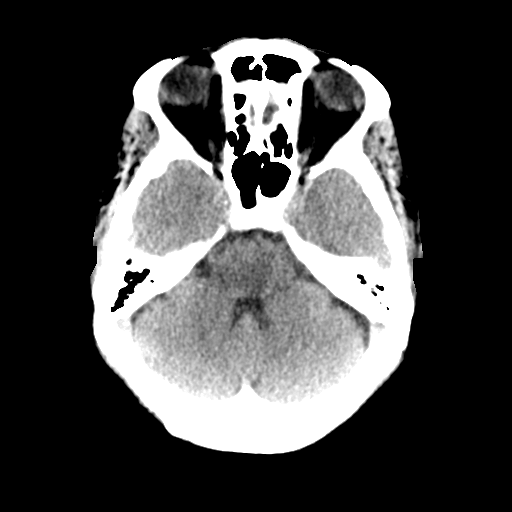
[im 12/31  brain]
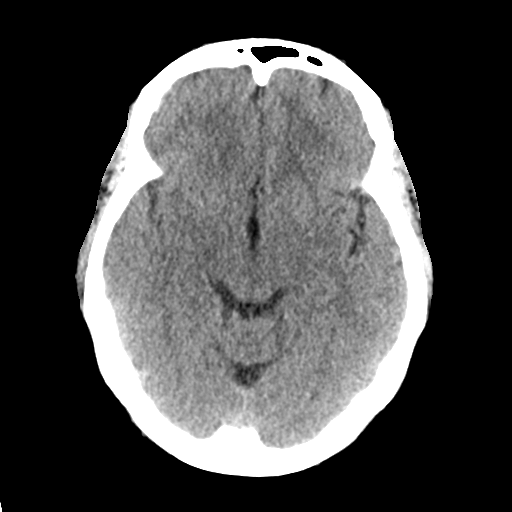
[im 16/31  brain]
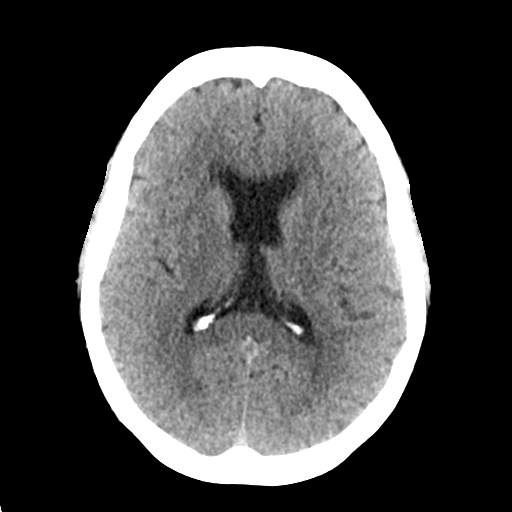
[im 19/31  brain]
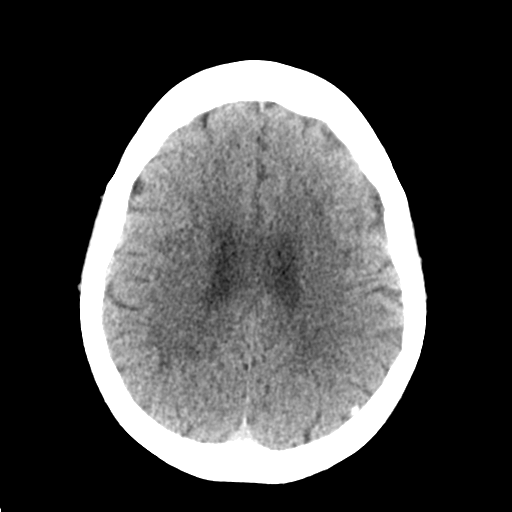
[im 19/31  bone]
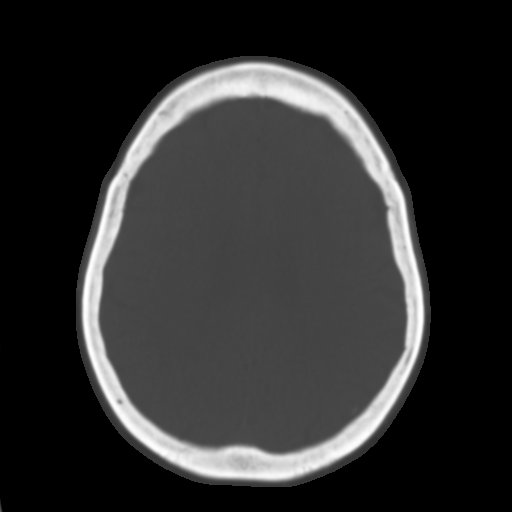
[im 23/31  brain]
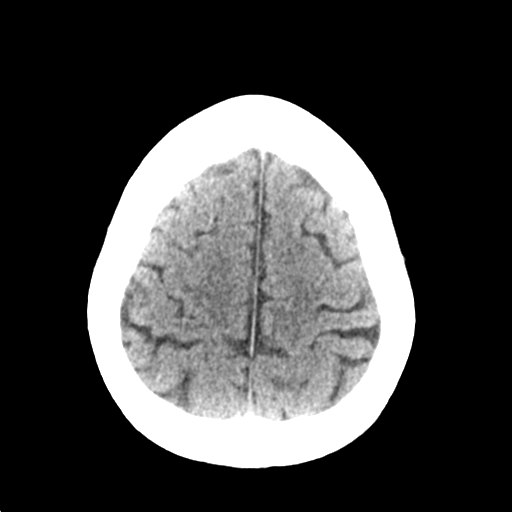
[im 27/31  brain]
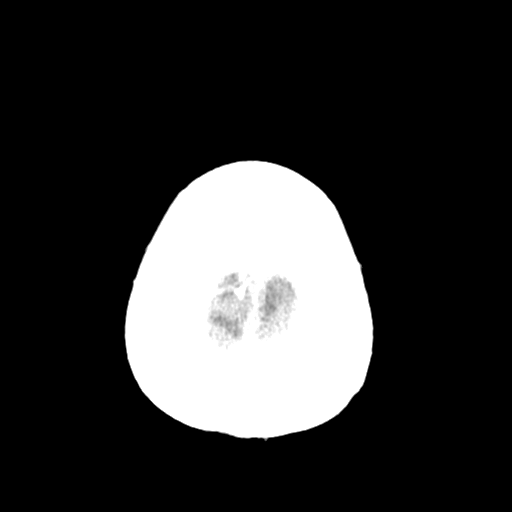

[Series 3: head bone · axial · 0.42mm/px · z∈[-137,-13]mm · 8 of 78 slices shown]
[im 8/78  bone]
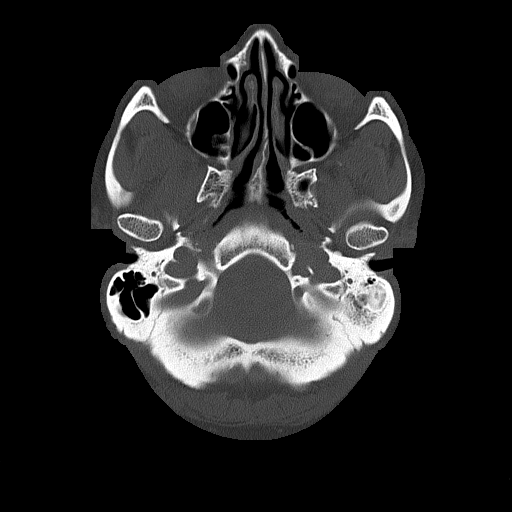
[im 16/78  bone]
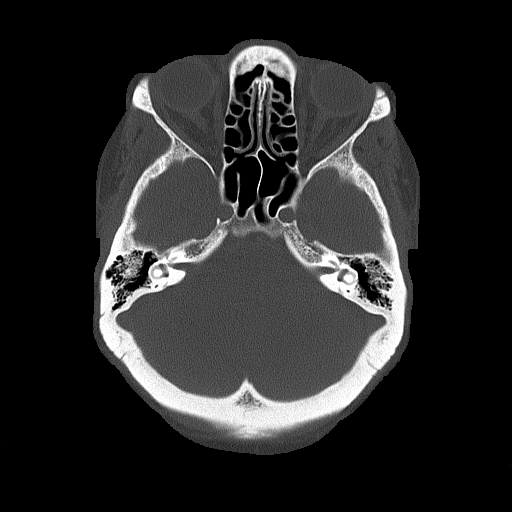
[im 24/78  bone]
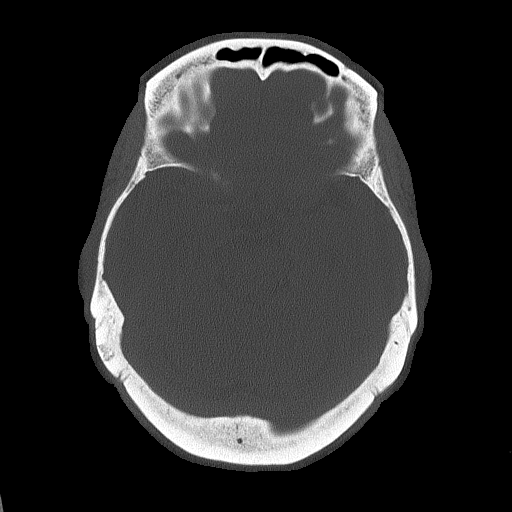
[im 35/78  bone]
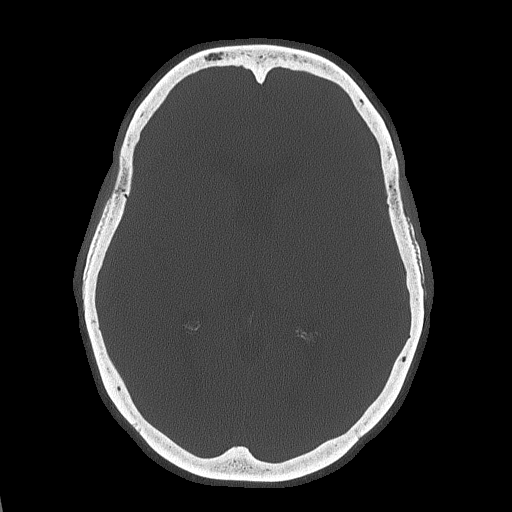
[im 43/78  bone]
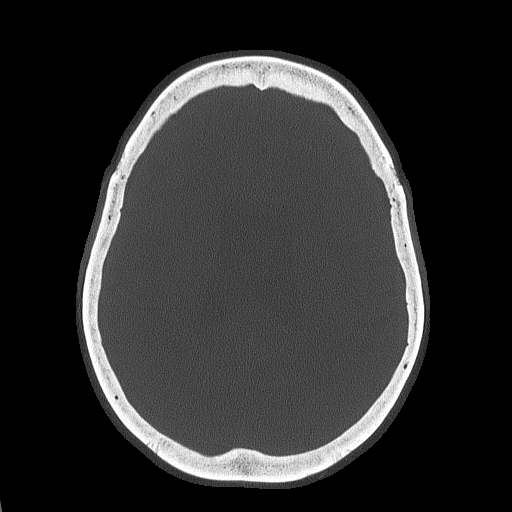
[im 54/78  bone]
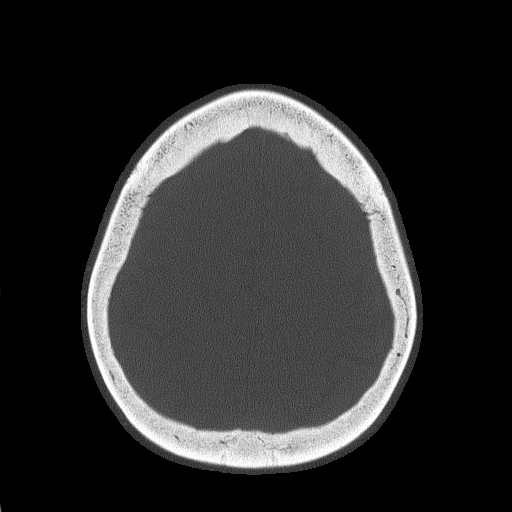
[im 62/78  bone]
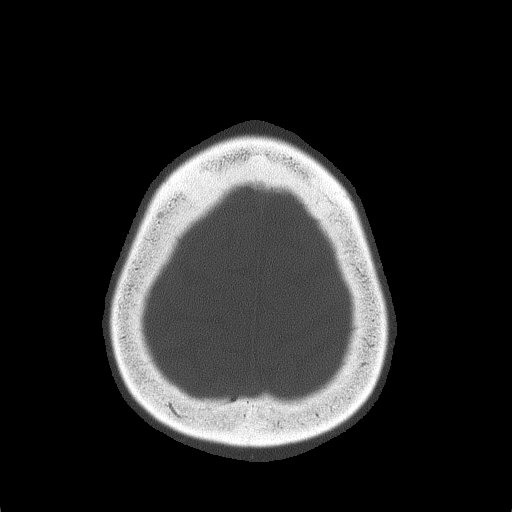
[im 70/78  bone]
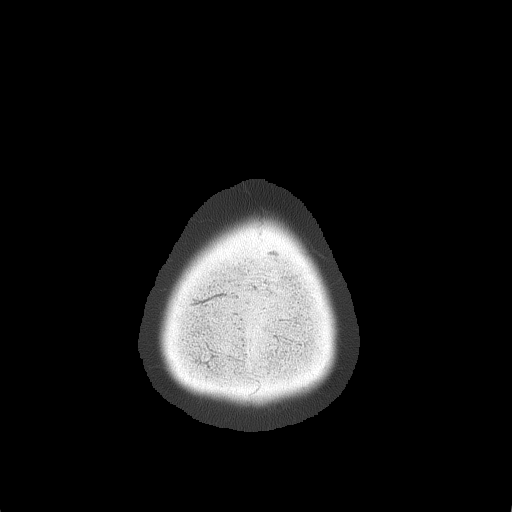

[15 of 30 positions shown; findings below may reference images not displayed]

FINDINGS: There is mild low attenuation throughout the subcortical and
periventricular white matter compatible with chronic microvascular
disease. The ventricular volumes and sulci appear normal. There is
no abnormal extra-axial fluid collections, intracranial hemorrhage
or mass. No evidence for acute brain infarct. The paranasal sinuses
are clear. The mastoid air cells are also clear. The calvarium
appears intact.
IMPRESSION: 1. No acute intracranial abnormalities.
2. Mild small vessel ischemic change, chronic.

## 2015-05-10 MED ORDER — IBUPROFEN 800 MG PO TABS: 800.0000 mg | ORAL_TABLET | Freq: Three times a day (TID) | ORAL | Status: DC

## 2015-05-10 NOTE — ED Notes (Signed)
Pt. reports headache onset yesterday seen by her PCP today advised to go to ER for further testing , denies head injury , no nausea or fever . Pt. is hypertensive at triage .

## 2015-05-10 NOTE — ED Provider Notes (Signed)
CSN: [id number]     Arrival date & time 05/10/15  1950 History   First MD Initiated Contact with Patient 05/10/15 2125     Chief Complaint  Patient presents with  . Headache  . Hypertension     (Consider location/radiation/quality/duration/timing/severity/associated sxs/prior Treatment) HPI Comments: The patient is a 65 year old female, she has a history of hypertension, she does not get headaches very often however yesterday at 6:00 in the evening while she was doing a puzzle she developed gradual onset of a headache above her left eye but then progressed to involve the left side of her temporal area and parietal scalp. This has been persistent throughout the evening and throughout the day today, she took a Tylenol as well as ibuprofen without relief, finally took a migraine medication from a family member and since that time her headache has essentially gone away, it is currently 1 out of 10, there is no associated fevers, chills, stiff neck, no numbness, no weakness, no blurred vision, no photophobia or phonophobia.  Patient is a 65 y.o. female presenting with headaches and hypertension. The history is provided by the patient.  Headache Hypertension Associated symptoms include headaches.    Past Medical History  Diagnosis Date  . Hypertension    Past Surgical History  Procedure Laterality Date  . Abdominal hysterectomy     Family History  Problem Relation Age of Onset  . Other Mother     pacemaker   Social History  Substance Use Topics  . Smoking status: Never Smoker   . Smokeless tobacco: None  . Alcohol Use: No   OB History    No data available     Review of Systems  Neurological: Positive for headaches.  All other systems reviewed and are negative.     Allergies  Review of patient's allergies indicates no known allergies.  Home Medications   Prior to Admission medications   Medication Sig Start Date End Date Taking? Authorizing Provider  estrogens,  conjugated, (PREMARIN) 0.625 MG tablet Take 0.625 mg by mouth every other day.   Yes Historical Provider, MD  lisinopril (PRINIVIL,ZESTRIL) 20 MG tablet Take 20 mg by mouth daily. 10/19/13  Yes Historical Provider, MD  loratadine (CLARITIN) 10 MG tablet Take 10 mg by mouth daily as needed for allergies.    Yes Historical Provider, MD  metoprolol (LOPRESSOR) 25 MG tablet Take 1 tablet (25 mg total) by mouth as needed (palpitations). 08/16/14  Yes Eber Hong, MD  Multiple Vitamin (MULTIVITAMIN) capsule Take 1 capsule by mouth daily.   Yes Historical Provider, MD  oxymetazoline (AFRIN) 0.05 % nasal spray Place 1 spray into both nostrils at bedtime as needed for congestion.   Yes Historical Provider, MD  pantoprazole (PROTONIX) 20 MG tablet Take 20 mg by mouth daily as needed for heartburn or indigestion.  04/13/15  Yes Historical Provider, MD  ibuprofen (ADVIL,MOTRIN) 800 MG tablet Take 1 tablet (800 mg total) by mouth 3 (three) times daily. 05/10/15   Eber Hong, MD  metoprolol succinate (TOPROL XL) 25 MG 24 hr tablet 1/2 tablet by mouth daily Patient not taking: Reported on 05/10/2015 09/10/14   Cassell Clement, MD   BP 177/83 mmHg  Pulse 56  Temp(Src) 98.4 F (36.9 C) (Oral)  Resp 16  Ht  (1.753 m)  Wt 226 lb (102.513 kg)  BMI 33.36 kg/m2  SpO2 99% Physical Exam  Constitutional: She appears well-developed and well-nourished. No distress.  HENT:  Head: Normocephalic and atraumatic.  Mouth/Throat: Oropharynx is  clear and moist. No oropharyngeal exudate.  Eyes: Conjunctivae and EOM are normal. Pupils are equal, round, and reactive to light. Right eye exhibits no discharge. Left eye exhibits no discharge. No scleral icterus.  Neck: Normal range of motion. Neck supple. No JVD present. No thyromegaly present.  Cardiovascular: Normal rate, regular rhythm, normal heart sounds and intact distal pulses.  Exam reveals no gallop and no friction rub.   No murmur heard. Pulmonary/Chest: Effort normal  and breath sounds normal. No respiratory distress. She has no wheezes. She has no rales.  Abdominal: Soft. Bowel sounds are normal. She exhibits no distension and no mass. There is no tenderness.  Musculoskeletal: Normal range of motion. She exhibits no edema or tenderness.  Lymphadenopathy:    She has no cervical adenopathy.  Neurological: She is alert. Coordination normal.  Speech is clear, cranial nerves III through XII are intact, memory is intact, strength is normal in all 4 extremities including grips, sensation is intact to light touch and pinprick in all 4 extremities. Coordination as tested by finger-nose-finger is normal, no limb ataxia. Normal gait, normal reflexes at the patellar tendons bilaterally  Skin: Skin is warm and dry. No rash noted. No erythema.  Psychiatric: She has a normal mood and affect. Her behavior is normal.  Nursing note and vitals reviewed.   ED Course  Procedures (including critical care time) Labs Review Labs Reviewed  BASIC METABOLIC PANEL - Abnormal; Notable for the following:    Glucose, Bld 141 (*)    All other components within normal limits  CBC - Abnormal; Notable for the following:    RBC 5.71 (*)    MCV 71.3 (*)    MCH 22.6 (*)    All other components within normal limits    Imaging Review Ct Head Wo Contrast  05/10/2015  CLINICAL DATA:  Headache for 24 hours.  Nausea and vomiting. EXAM: CT HEAD WITHOUT CONTRAST TECHNIQUE: Contiguous axial images were obtained from the base of the skull through the vertex without intravenous contrast. COMPARISON:  01/06/2009 FINDINGS: There is mild low attenuation throughout the subcortical and periventricular white matter compatible with chronic microvascular disease. The ventricular volumes and sulci appear normal. There is no abnormal extra-axial fluid collections, intracranial hemorrhage or mass. No evidence for acute brain infarct. The paranasal sinuses are clear. The mastoid air cells are also clear. The  calvarium appears intact. IMPRESSION: 1. No acute intracranial abnormalities. 2. Mild small vessel ischemic change, chronic. Electronically Signed   By: Signa Kell M.D.   On: 05/10/2015 23:47   I have personally reviewed and evaluated these images and lab results as part of my medical decision-making.    MDM   Final diagnoses:  Nonintractable headache, unspecified chronicity pattern, unspecified headache type    The patient is well-appearing, her blood pressure is slightly elevated, lowest pressure here was 166/73, no fever or tachycardia and a neurologic exam which is unremarkable and reassuring. Otherwise the lab work is also unremarkable and reassuring, her symptoms were gradual in onset, they are not typical for subarachnoid hemorrhage, there is no signs of bleeding or intracerebral hemorrhage seen on the CT scan. The patient has been instructed on indications for return, she has been given her results, she appears stable for discharge.  CT scan is negative for acute findings, patient stable for discharge  In addition to written d/c instructions, the pt was given verbal d/c instructions including the indications for return and expressed understanding to the instructions.   Meds given  in ED:  Medications - No data to display  New Prescriptions   IBUPROFEN (ADVIL,MOTRIN) 800 MG TABLET    Take 1 tablet (800 mg total) by mouth 3 (three) times daily.      Eber Hong, MD 05/10/15 [phone]

## 2015-05-10 NOTE — Discharge Instructions (Signed)

## 2015-10-15 ENCOUNTER — Other Ambulatory Visit: Payer: Self-pay

## 2015-10-15 MED ORDER — METOPROLOL SUCCINATE ER 25 MG PO TB24: ORAL_TABLET | ORAL | Status: DC

## 2015-10-15 NOTE — Telephone Encounter (Signed)
AVS Reports     Date/Time Report Action User    09/10/2014 8:47 AM After Visit Summary Printed Jordan Barron      Patient Instructions     Medication Instructions:  START TOPROL XL (METOPROLOL SUC) 25 MG 1/2 TABLET DAILY  Labwork: NONE  Testing/Procedures: Your physician has requested that you have an echocardiogram. Echocardiography is a painless test that uses sound waves to create images of your heart. It provides your doctor with information about the size and shape of your heart and how well your heart's chambers and valves are working. This procedure takes approximately one hour. There are no restrictions for this procedure.  Your physician has recommended that you wear an event monitor. Event monitors are medical devices that record the heart's electrical activity. Doctors most often us these monitors to diagnose arrhythmias. Arrhythmias are problems with the speed or rhythm of the heartbeat. The monitor is a small, portable device. You can wear one while you do your normal daily activities. This is usually used to diagnose what is causing palpitations/syncope (passing out). 30 DAY   Follow-Up: Your physician recommends that you schedule a follow-up appointment in: 2 MONTH OV/EKG    Per Dr. Yevonne PaxBrackbill's last note the pt was supposed to follow up in 2 months. The pt hasn't been seen by any other provider in the office. Pt will receive a 30 supply and be instructed to come for follow up with first available.

## 2015-11-08 ENCOUNTER — Ambulatory Visit: Payer: Medicare Other | Admitting: Skilled Nursing Facility1

## 2015-11-11 ENCOUNTER — Other Ambulatory Visit: Payer: Self-pay | Admitting: Cardiovascular Disease

## 2015-11-15 NOTE — Telephone Encounter (Signed)
Patient has not been seen nor is she scheduled with Dr. Elease HashimotoNahser

## 2015-11-17 ENCOUNTER — Other Ambulatory Visit: Payer: Self-pay | Admitting: *Deleted

## 2017-05-27 ENCOUNTER — Emergency Department (HOSPITAL_COMMUNITY)
Admission: EM | Admit: 2017-05-27 | Discharge: 2017-05-27 | Disposition: A | Discharge status: Home / Self Care | Payer: Medicare Other | Attending: Emergency Medicine | Admitting: Emergency Medicine

## 2017-05-27 ENCOUNTER — Other Ambulatory Visit: Payer: Self-pay

## 2017-05-27 ENCOUNTER — Encounter (HOSPITAL_COMMUNITY): Payer: Self-pay

## 2017-05-27 DIAGNOSIS — I471 Supraventricular tachycardia: Secondary | ICD-10-CM | POA: Insufficient documentation

## 2017-05-27 DIAGNOSIS — Z79899 Other long term (current) drug therapy: Secondary | ICD-10-CM | POA: Diagnosis not present

## 2017-05-27 DIAGNOSIS — I10 Essential (primary) hypertension: Secondary | ICD-10-CM | POA: Insufficient documentation

## 2017-05-27 DIAGNOSIS — R000 Tachycardia, unspecified: Secondary | ICD-10-CM | POA: Diagnosis present

## 2017-05-27 LAB — BASIC METABOLIC PANEL
ANION GAP: 12 (ref 5–15)
BUN: 15 mg/dL (ref 6–20)
CALCIUM: 9.1 mg/dL (ref 8.9–10.3)
CO2: 21 mmol/L — ABNORMAL LOW (ref 22–32)
CREATININE: 0.88 mg/dL (ref 0.44–1.00)
Chloride: 103 mmol/L (ref 101–111)
GLUCOSE: 201 mg/dL — AB (ref 65–99)
Potassium: 3.9 mmol/L (ref 3.5–5.1)
Sodium: 136 mmol/L (ref 135–145)

## 2017-05-27 LAB — CBC WITH DIFFERENTIAL/PLATELET
BASOS ABS: 0.1 10*3/uL (ref 0.0–0.1)
BASOS PCT: 1 %
EOS PCT: 3 %
Eosinophils Absolute: 0.3 10*3/uL (ref 0.0–0.7)
HEMATOCRIT: 44.9 % (ref 36.0–46.0)
Hemoglobin: 14.3 g/dL (ref 12.0–15.0)
Lymphocytes Relative: 39 %
Lymphs Abs: 3.8 10*3/uL (ref 0.7–4.0)
MCH: 23 pg — ABNORMAL LOW (ref 26.0–34.0)
MCHC: 31.8 g/dL (ref 30.0–36.0)
MCV: 72.1 fL — AB (ref 78.0–100.0)
MONO ABS: 0.5 10*3/uL (ref 0.1–1.0)
MONOS PCT: 6 %
NEUTROS ABS: 5 10*3/uL (ref 1.7–7.7)
Neutrophils Relative %: 51 %
PLATELETS: 355 10*3/uL (ref 150–400)
RBC: 6.23 MIL/uL — ABNORMAL HIGH (ref 3.87–5.11)
RDW: 15.4 % (ref 11.5–15.5)
WBC: 9.6 10*3/uL (ref 4.0–10.5)

## 2017-05-27 LAB — MAGNESIUM: Magnesium: 2 mg/dL (ref 1.7–2.4)

## 2017-05-27 MED ORDER — ADENOSINE 6 MG/2ML IV SOLN
12.0000 mg | Freq: Once | INTRAVENOUS | Status: AC
  Administered 2017-05-27: 6 mg via INTRAVENOUS
  Filled 2017-05-27: qty 4

## 2017-05-27 NOTE — ED Notes (Signed)
Patient transported to restroom, able to ambulate independently, gait steady and even

## 2017-05-27 NOTE — ED Triage Notes (Signed)
Patient arrived by POV for heart racing since 0950. Patient deneis pain, alert and oriented, hx of same

## 2017-05-27 NOTE — ED Provider Notes (Signed)
[facility] EMERGENCY DEPARTMENT Provider Note  CSN: [id number] Arrival date & time: 05/27/17 1055  Chief Complaint(s) svt  HPI Jordan Barron is a 67 y.o. female with a history of SVT who presents to the emergency department with rapid heart rate and associated shortness of breath that began 2 hours prior to arrival.  This feels like prior episodes of SVT in the past.  She has tried vagal maneuvers without resolution.  No aggravating factors.  She denies any chest pain, nausea, vomiting, dizziness, focal weakness.  Denies any precipitating recent fevers or infections.  Denies any urinary symptoms.  Denies any other associated physical complaints.  HPI  Past Medical History Past Medical History:  Diagnosis Date  . Hypertension    Patient Active Problem List   Diagnosis Date Noted  . SVT (supraventricular tachycardia) (HCC) 09/10/2014  . Essential hypertension 09/10/2014   Home Medication(s) Prior to Admission medications   Medication Sig Start Date End Date Taking? Authorizing Provider  estrogens, conjugated, (PREMARIN) 0.625 MG tablet Take 0.625 mg by mouth every other day.    [provider]  ibuprofen (ADVIL,MOTRIN) 800 MG tablet Take 1 tablet (800 mg total) by mouth 3 (three) times daily. 05/10/15   Eber Hong, MD  lisinopril (PRINIVIL,ZESTRIL) 20 MG tablet Take 20 mg by mouth daily. 10/19/13   [provider]  loratadine (CLARITIN) 10 MG tablet Take 10 mg by mouth daily as needed for allergies.     [provider]  metoprolol (LOPRESSOR) 25 MG tablet Take 1 tablet (25 mg total) by mouth as needed (palpitations). 08/16/14   Eber Hong, MD  metoprolol succinate (TOPROL XL) 25 MG 24 hr tablet 1/2 tablet by mouth daily 10/15/15   Nahser, Deloris Ping, MD  Multiple Vitamin (MULTIVITAMIN) capsule Take 1 capsule by mouth daily.    [provider]  oxymetazoline (AFRIN) 0.05 % nasal spray Place 1 spray into both nostrils at bedtime as  needed for congestion.    [provider]  pantoprazole (PROTONIX) 20 MG tablet Take 20 mg by mouth daily as needed for heartburn or indigestion.  04/13/15   [provider]                                                                                                                                    Past Surgical History Past Surgical History:  Procedure Laterality Date  . ABDOMINAL HYSTERECTOMY     Family History Family History  Problem Relation Age of Onset  . Other Mother        pacemaker    Social History Social History   Tobacco Use  . Smoking status: Never Smoker  Substance Use Topics  . Alcohol use: No  . Drug use: No   Allergies Patient has no known allergies.  Review of Systems Review of Systems All other systems are reviewed and are negative for acute change except as noted in  the HPI  Physical Exam Vital Signs  I have reviewed the triage vital signs BP (!) 119/91   Pulse (!) 172   Resp (!) 24   SpO2 99%   Physical Exam  Constitutional: She is oriented to person, place, and time. She appears well-developed and well-nourished. No distress.  HENT:  Head: Normocephalic and atraumatic.  Nose: Nose normal.  Eyes: Conjunctivae and EOM are normal. Pupils are equal, round, and reactive to light. Right eye exhibits no discharge. Left eye exhibits no discharge. No scleral icterus.  Neck: Normal range of motion. Neck supple.  Cardiovascular: Regular rhythm. Tachycardia present. Exam reveals no gallop and no friction rub.  No murmur heard. Pulmonary/Chest: Effort normal and breath sounds normal. No stridor. No respiratory distress. She has no rales.  Abdominal: Soft. She exhibits no distension. There is no tenderness.  Musculoskeletal: She exhibits no edema or tenderness.  Neurological: She is alert and oriented to person, place, and time.  Skin: Skin is warm and dry. No rash noted. She is not diaphoretic. No erythema.  Psychiatric: She has a  normal mood and affect.  Vitals reviewed.   ED Results and Treatments Labs (all labs ordered are listed, but only abnormal results are displayed) Labs Reviewed  CBC WITH DIFFERENTIAL/PLATELET - Abnormal; Notable for the following components:      Result Value   RBC 6.23 (*)    MCV 72.1 (*)    MCH 23.0 (*)    All other components within normal limits  BASIC METABOLIC PANEL - Abnormal; Notable for the following components:   CO2 21 (*)    Glucose, Bld 201 (*)    All other components within normal limits  MAGNESIUM                                                                                                                         EKG  EKG Interpretation  Date/Time:  Sunday May 27 2017 11:01:59 EST Ventricular Rate:  183 PR Interval:    QRS Duration: 102 QT Interval:  254 QTC Calculation: 443 R Axis:   -49 Text Interpretation:  Supraventricular tachycardia Left axis deviation Incomplete right bundle branch block Left ventricular hypertrophy with repolarization abnormality Abnormal ECG Confirmed by Drema Pryardama, Pedro [phone]([id number]) on 05/27/2017 11:51:05 AM      EKG Interpretation  Date/Time:  Sunday May 27 2017 11:01:59 EST Ventricular Rate:  183 PR Interval:    QRS Duration: 102 QT Interval:  254 QTC Calculation: 443 R Axis:   -49 Text Interpretation:  Sinus rhythm Left anterior fascicular block Abnormal R-wave progression, early transition Left ventricular hypertrophy Borderline prolonged QT interval NO STEMI Confirmed by Drema Pryardama, Pedro ([id number]) on 05/27/2017 11:52:18 AM  Radiology No results found. Pertinent labs & imaging results that were available during my care of the patient were reviewed by me and considered in my medical decision making (see chart for details).  Medications Ordered in ED Medications  adenosine (ADENOCARD) 6 MG/2ML injection 12 mg (6  mg Intravenous Given 05/27/17 1136)                                                                                                                                     Procedures .Cardioversion Date/Time: 05/27/2017 2:11 PM Performed by: Nira Conn, MD Authorized by: Nira Conn, MD   Consent:    Consent obtained:  Verbal   Consent given by:  Patient   Risks discussed:  Induced arrhythmia Pre-procedure details:    Cardioversion basis:  Emergent   Rhythm:  Supraventricular tachycardia   Electrode placement:  Anterior-lateral Patient sedated: No Attempt one:    Cardioversion mode attempt one: Chemical cardioversion with 6 mg of adenosine. Post-procedure details:    Patient tolerance of procedure:  Tolerated well, no immediate complications Comments:     Successful cardioversion to normal sinus rhythm     CRITICAL CARE Performed by: Amadeo Garnet Cardama Total critical care time: 30 minutes Critical care time was exclusive of separately billable procedures and treating other patients. Critical care was necessary to treat or prevent imminent or life-threatening deterioration. Critical care was time spent personally by me on the following activities: development of treatment plan with patient and/or surrogate as well as nursing, discussions with consultants, evaluation of patient's response to treatment, examination of patient, obtaining history from patient or surrogate, ordering and performing treatments and interventions, ordering and review of laboratory studies, ordering and review of radiographic studies, pulse oximetry and re-evaluation of patient's condition.   (including critical care time)  Medical Decision Making / ED Course I have reviewed the nursing notes for this encounter and the patient's prior records (if available in EHR or on provided paperwork).  Clinical Course as of May 27 1409  Sun May 27, 2017  1125 Patient in SVT on EKG.  Vagal maneuvers attempted and unsuccessful.  She is currently hemodynamically stable.  Will attempt adenosine for chemical  cardioversion.  [PC]  1155 Successfully converted with 6 mg of adenosine.  Remains hemodynamically stable.  We will continue to monitor.  [PC]  1410 Remained hemodynamically stable without recurrence.  Labs grossly reassuring without significant electrolyte derangements.  The patient is safe for discharge with strict return precautions.   [PC]    Clinical Course User Index [PC] Cardama, Amadeo Garnet, MD     Final Clinical Impression(s) / ED Diagnoses Final diagnoses:  SVT (supraventricular tachycardia) (HCC)   Disposition: Discharge  Condition: Good  I have discussed the results, Dx and Tx plan with the patient who expressed understanding and agree(s) with the plan. Discharge instructions discussed at great length. The patient was given strict return precautions who verbalized understanding of the instructions. No further questions at time of discharge.    ED Discharge Orders    None       Follow Up: Primary care provider  Schedule an appointment as soon as possible for a visit  As needed  [facility] CARDIOVASCULAR DIVISION [address] [place] [address] [phone] Schedule an appointment as soon as possible for a visit  As needed      This chart was dictated using voice recognition software.  Despite best efforts to proofread,  errors can occur which can change the documentation meaning.   Nira Conn, MD 05/27/17 1414

## 2017-05-27 NOTE — ED Notes (Signed)
Placed on zoll pads

## 2017-05-27 NOTE — ED Notes (Signed)
Attempted IV stick X1, blood return noted but catheter would not thread. Blood work collected.

## 2017-06-14 ENCOUNTER — Ambulatory Visit (INDEPENDENT_AMBULATORY_CARE_PROVIDER_SITE_OTHER): Payer: Medicare Other | Admitting: Internal Medicine

## 2017-06-14 ENCOUNTER — Encounter: Payer: Self-pay | Admitting: Internal Medicine

## 2017-06-14 VITALS — BP 154/98 | HR 69 | Ht 69.0 in | Wt 230.8 lb

## 2017-06-14 DIAGNOSIS — I471 Supraventricular tachycardia: Secondary | ICD-10-CM

## 2017-06-14 DIAGNOSIS — I10 Essential (primary) hypertension: Secondary | ICD-10-CM | POA: Diagnosis not present

## 2017-06-14 MED ORDER — METOPROLOL TARTRATE 25 MG PO TABS: 25.0000 mg | ORAL_TABLET | Freq: Two times a day (BID) | ORAL | 3 refills | Status: DC

## 2017-06-14 NOTE — Progress Notes (Signed)
Follow-up Outpatient Visit Date: 06/14/2017  Primary Care Provider: [facility] [address] [place] [address]  Chief Complaint: Palpitations  HPI:  Jordan Barron is a 67 y.o. year-old female with history of SVT and hypertension, who presents for follow-up of SVT. He was evaluated in 2016 by Dr. Patty Sermons for SVT. He recommended metoprolol succinate 12.5 mg daily, Holter monitor, and echocardiogram. Echo was normal. It does not appear that she ever wore the Holter monitor. Jordan Barron presented to the [facility] ED last month (2/17) with palpitations and shortness of breath. EKG showed SVT, which was successfully terminated with adenosine 6 mg IV x 1.  Today, Jordan Barron reports she is feeling well. On the day of her recent hospitalization with SVT, she awoke feeling well but then had acute onset of palpitations while sitting still. She attempted vagal maneuvers without any improvement. She contacted her daughter, who took her to the emergency department. While in route, she also noted some shortness of breath. She did not have any chest pain or lightheadedness. After conversion to sinus rhythm with adenosine, Jordan Barron reported feeling back to normal. This has only been her third episode of SVT, and the first since her last visit with Dr. Patty Sermons in 2016. She has been taking metoprolol succinate 25 mg daily but did not have an active prescription for as needed metoprolol tartrate anymore.  Jordan Barron otherwise feels well, denying chest pain, shortness of breath, orthopnea, and PND. She noted some mild swelling in her feet a few days ago. She does not check her blood pressure regularly at home, though it has been elevated and multiple physician visits in the past. She does not follow a low-salt diet. She has been fairly sedentary and has put on weight over the last few  years.  --------------------------------------------------------------------------------------------------  Cardiovascular History & Procedures: Cardiovascular Problems:  Paroxysmal supraventricular tachycardia  Risk Factors:  Hypertension, obesity, and age > 54  Cath/PCI:  None  CV Surgery:  None  EP Procedures and Devices:  None  Non-Invasive Evaluation(s):  TTE (09/17/14): Normal LV size and wall thickness. LVEF 65% with normal wall motion. Normal RV size and function. No significant valvular abnormalities.  Recent CV Pertinent Labs: Lab Results  Component Value Date   K 3.9 05/27/2017   MG 2.0 05/27/2017   BUN 15 05/27/2017   CREATININE 0.88 05/27/2017     Recent CV Pertinent Labs: Lab Results  Component Value Date   K 3.9 05/27/2017   MG 2.0 05/27/2017   BUN 15 05/27/2017   CREATININE 0.88 05/27/2017    Past medical and surgical history were reviewed and updated in EPIC.  Current Meds  Medication Sig  . estrogens, conjugated, (PREMARIN) 0.625 MG tablet Take 0.625 mg by mouth every other day.  . lisinopril (PRINIVIL,ZESTRIL) 20 MG tablet Take 20 mg by mouth daily.  Marland Kitchen loratadine (CLARITIN) 10 MG tablet Take 10 mg by mouth daily as needed for allergies.   . metoprolol succinate (TOPROL-XL) 25 MG 24 hr tablet Take 25 mg by mouth daily.  . Multiple Vitamin (MULTIVITAMIN) capsule Take 1 capsule by mouth daily.  . pantoprazole (PROTONIX) 20 MG tablet Take 20 mg by mouth daily as needed for heartburn or indigestion.   . [DISCONTINUED] ibuprofen (ADVIL,MOTRIN) 800 MG tablet Take 1 tablet (800 mg total) by mouth 3 (three) times daily.  . [DISCONTINUED] metoprolol succinate (TOPROL XL) 25 MG 24 hr tablet 1/2 tablet by mouth daily  . [DISCONTINUED] metoprolol  tartrate (LOPRESSOR) 25 MG tablet Take 25 mg by mouth daily.  . [DISCONTINUED] oxymetazoline (AFRIN) 0.05 % nasal spray Place 1 spray into both nostrils at bedtime as needed for congestion.    Allergies:  Patient has no known allergies.  Social History   Socioeconomic History  . Marital status: Single    Spouse name: Not on file  . Number of children: Not on file  . Years of education: Not on file  . Highest education level: Not on file  Social Needs  . Financial resource strain: Not on file  . Food insecurity - worry: Not on file  . Food insecurity - inability: Not on file  . Transportation needs - medical: Not on file  . Transportation needs - non-medical: Not on file  Occupational History  . Not on file  Tobacco Use  . Smoking status: Never Smoker  . Smokeless tobacco: Never Used  Substance and Sexual Activity  . Alcohol use: No  . Drug use: No  . Sexual activity: Not on file  Other Topics Concern  . Not on file  Social History Narrative  . Not on file    Family History  Problem Relation Age of Onset  . Other Mother        pacemaker    Review of Systems: A 12-system review of systems was performed and was negative except as noted in the HPI.  --------------------------------------------------------------------------------------------------  Physical Exam: BP (!) 154/98   Pulse 69   Ht 5\' 9"  (1.753 m)   Wt 230 lb 12.8 oz (104.7 kg)   SpO2 98%   BMI 34.08 kg/m   General:  Obese woman, seated comfortably in the exam room. She is accompanied by her sister. HEENT: No conjunctival pallor or scleral icterus. Moist mucous membranes.  OP clear. Neck: Supple without lymphadenopathy, thyromegaly, JVD, or HJR.  Lungs: Normal work of breathing. Clear to auscultation bilaterally without wheezes or crackles. Heart: Regular rate and rhythm without murmurs, rubs, or gallops. Non-displaced PMI. Abd: Bowel sounds present. Soft, NT/ND without hepatosplenomegaly Ext: No lower extremity edema. Radial, PT, and DP pulses are 2+ bilaterally. Skin: Warm and dry without rash.  EKG: Tracing at 11:01 demonstrates SVT with LAFB. Tracing of 11:41 demonstrates sinus rhythm with LAFB  and LVH.  Lab Results  Component Value Date   WBC 9.6 05/27/2017   HGB 14.3 05/27/2017   HCT 44.9 05/27/2017   MCV 72.1 (L) 05/27/2017   PLT 355 05/27/2017    Lab Results  Component Value Date   NA 136 05/27/2017   K 3.9 05/27/2017   CL 103 05/27/2017   CO2 21 (L) 05/27/2017   BUN 15 05/27/2017   CREATININE 0.88 05/27/2017   GLUCOSE 201 (H) 05/27/2017    No results found for: CHOL, HDL, LDLCALC, LDLDIRECT, TRIG, CHOLHDL  --------------------------------------------------------------------------------------------------  ASSESSMENT AND PLAN: Paroxysmal supraventricular tachycardia Jordan Barron has only experienced one recurrence since 2016, though this led to recent ED visit. I have encouraged continued use of metoprolol succinate 25 mg daily. Resting heart rate in the 60s precludes further up titration. We have discussed vagal maneuvers. I have also provided her with a new prescription for metoprolol tartrate 25 mg to be taken twice daily as needed for palpitations. If the palpitations do not abate within 15-30 minutes of taking the as needed metoprolol tartrate, she should seek immediate medical attention. If she has a recurrence in the near future, we will refer her to electrophysiology for consideration of EP study and catheter  ablation.  Essential hypertension Blood pressure suboptimally controlled today. We have discussed lifestyle modifications including sodium reduction and exercise. I suggested that we increase lisinopril to 40 mg daily, though Ms. Dicesare wished to defer this. She is scheduled for follow-up with her PCP next month, at which time this will need to be readdressed.  Follow-up: Return to clinic in 6 months.  Yvonne Kendall, MD 06/14/2017 10:24 AM

## 2017-06-14 NOTE — Patient Instructions (Addendum)
Medication Instructions:  Metoprolol Tartrate (lopressor) 25 mg twice per day   -- If you need a refill on your cardiac medications before your next appointment, please call your pharmacy. --  Labwork: None ordered  Testing/Procedures: None ordered  Follow-Up: Your physician wants you to follow-up in: 6 months with Dr. Okey Dupre.    You will receive a reminder letter in the mail two months in advance. If you don't receive a letter, please call our office to schedule the follow-up appointment.  Thank you for choosing [facility]!!    Any Other Special Instructions Will Be Listed Below (If Applicable).    DASH Eating Plan DASH stands for "Dietary Approaches to Stop Hypertension." The DASH eating plan is a healthy eating plan that has been shown to reduce high blood pressure (hypertension). It may also reduce your risk for type 2 diabetes, heart disease, and stroke. The DASH eating plan may also help with weight loss. What are tips for following this plan? General guidelines  Avoid eating more than 2,300 mg (milligrams) of salt (sodium) a day. If you have hypertension, you may need to reduce your sodium intake to 1,500 mg a day.  Limit alcohol intake to no more than 1 drink a day for nonpregnant women and 2 drinks a day for men. One drink equals 12 oz of beer, 5 oz of wine, or 1 oz of hard liquor.  Work with your health care provider to maintain a healthy body weight or to lose weight. Ask what an ideal weight is for you.  Get at least 30 minutes of exercise that causes your heart to beat faster (aerobic exercise) most days of the week. Activities may include walking, swimming, or biking.  Work with your health care provider or diet and nutrition specialist (dietitian) to adjust your eating plan to your individual calorie needs. Reading food labels  Check food labels for the amount of sodium per serving. Choose foods with less than 5 percent of the Daily Value of sodium.  Generally, foods with less than 300 mg of sodium per serving fit into this eating plan.  To find whole grains, look for the word "whole" as the first word in the ingredient list. Shopping  Buy products labeled as "low-sodium" or "no salt added."  Buy fresh foods. Avoid canned foods and premade or frozen meals. Cooking  Avoid adding salt when cooking. Use salt-free seasonings or herbs instead of table salt or sea salt. Check with your health care provider or pharmacist before using salt substitutes.  Do not fry foods. Cook foods using healthy methods such as baking, boiling, grilling, and broiling instead.  Cook with heart-healthy oils, such as olive, canola, soybean, or sunflower oil. Meal planning   Eat a balanced diet that includes: ? 5 or more servings of fruits and vegetables each day. At each meal, try to fill half of your plate with fruits and vegetables. ? Up to 6-8 servings of whole grains each day. ? Less than 6 oz of lean meat, poultry, or fish each day. A 3-oz serving of meat is about the same size as a deck of cards. One egg equals 1 oz. ? 2 servings of low-fat dairy each day. ? A serving of nuts, seeds, or beans 5 times each week. ? Heart-healthy fats. Healthy fats called Omega-3 fatty acids are found in foods such as flaxseeds and coldwater fish, like sardines, salmon, and mackerel.  Limit how much you eat of the following: ? Canned or prepackaged foods. ?  Food that is high in trans fat, such as fried foods. ? Food that is high in saturated fat, such as fatty meat. ? Sweets, desserts, sugary drinks, and other foods with added sugar. ? Full-fat dairy products.  Do not salt foods before eating.  Try to eat at least 2 vegetarian meals each week.  Eat more home-cooked food and less restaurant, buffet, and fast food.  When eating at a restaurant, ask that your food be prepared with less salt or no salt, if possible. What foods are recommended? The items listed may  not be a complete list. Talk with your dietitian about what dietary choices are best for you. Grains Whole-grain or whole-wheat bread. Whole-grain or whole-wheat pasta. Brown rice. Orpah Cobbatmeal. Quinoa. Bulgur. Whole-grain and low-sodium cereals. Pita bread. Low-fat, low-sodium crackers. Whole-wheat flour tortillas. Vegetables Fresh or frozen vegetables (raw, steamed, roasted, or grilled). Low-sodium or reduced-sodium tomato and vegetable juice. Low-sodium or reduced-sodium tomato sauce and tomato paste. Low-sodium or reduced-sodium canned vegetables. Fruits All fresh, dried, or frozen fruit. Canned fruit in natural juice (without added sugar). Meat and other protein foods Skinless chicken or Malawiturkey. Ground chicken or Mal[place]iturkey. Pork with fat trimmed off. Fish and seafood. Egg whites. Dried beans, peas, or lentils. Unsalted nuts, nut butters, and seeds. Unsalted canned beans. Lean cuts of beef with fat trimmed off. Low-sodium, lean deli meat. Dairy Low-fat (1%) or fat-free (skim) milk. Fat-free, low-fat, or reduced-fat cheeses. Nonfat, low-sodium ricotta or cottage cheese. Low-fat or nonfat yogurt. Low-fat, low-sodium cheese. Fats and oils Soft margarine without trans fats. Vegetable oil. Low-fat, reduced-fat, or light mayonnaise and salad dressings (reduced-sodium). Canola, safflower, olive, soybean, and sunflower oils. Avocado. Seasoning and other foods Herbs. Spices. Seasoning mixes without salt. Unsalted popcorn and pretzels. Fat-free sweets. What foods are not recommended? The items listed may not be a complete list. Talk with your dietitian about what dietary choices are best for you. Grains Baked goods made with fat, such as croissants, muffins, or some breads. Dry pasta or rice meal packs. Vegetables Creamed or fried vegetables. Vegetables in a cheese sauce. Regular canned vegetables (not low-sodium or reduced-sodium). Regular canned tomato sauce and paste (not low-sodium or reduced-sodium).  Regular tomato and vegetable juice (not low-sodium or reduced-sodium). Rosita FirePickles. Olives. Fruits Canned fruit in a light or heavy syrup. Fried fruit. Fruit in cream or butter sauce. Meat and other protein foods Fatty cuts of meat. Ribs. Fried meat. Tomasa BlaseBacon. Sausage. Bologna and other processed lunch meats. Salami. Fatback. Hotdogs. Bratwurst. Salted nuts and seeds. Canned beans with added salt. Canned or smoked fish. Whole eggs or egg yolks. Chicken or Malawiturkey with skin. Dairy Whole or 2% milk, cream, and half-and-half. Whole or full-fat cream cheese. Whole-fat or sweetened yogurt. Full-fat cheese. Nondairy creamers. Whipped toppings. Processed cheese and cheese spreads. Fats and oils Butter. Stick margarine. Lard. Shortening. Ghee. Bacon fat. Tropical oils, such as coconut, palm kernel, or palm oil. Seasoning and other foods Salted popcorn and pretzels. Onion salt, garlic salt, seasoned salt, table salt, and sea salt. Worcestershire sauce. Tartar sauce. Barbecue sauce. Teriyaki sauce. Soy sauce, including reduced-sodium. Steak sauce. Canned and packaged gravies. Fish sauce. Oyster sauce. Cocktail sauce. Horseradish that you find on the shelf. Ketchup. Mustard. Meat flavorings and tenderizers. Bouillon cubes. Hot sauce and Tabasco sauce. Premade or packaged marinades. Premade or packaged taco seasonings. Relishes. Regular salad dressings. Where to find more information:  National Heart, Lung, and Blood Institute: PopSteam.iswww.nhlbi.nih.gov  American Heart Association: www.heart.org Summary  The DASH eating plan is  a healthy eating plan that has been shown to reduce high blood pressure (hypertension). It may also reduce your risk for type 2 diabetes, heart disease, and stroke.  With the DASH eating plan, you should limit salt (sodium) intake to 2,300 mg a day. If you have hypertension, you may need to reduce your sodium intake to 1,500 mg a day.  When on the DASH eating plan, aim to eat more fresh fruits and  vegetables, whole grains, lean proteins, low-fat dairy, and heart-healthy fats.  Work with your health care provider or diet and nutrition specialist (dietitian) to adjust your eating plan to your individual calorie needs. This information is not intended to replace advice given to you by your health care provider. Make sure you discuss any questions you have with your health care provider. Document Released: 03/16/2011 Document Revised: 03/20/2016 Document Reviewed: 03/20/2016 Elsevier Interactive Patient Education  [employer].

## 2018-01-07 ENCOUNTER — Ambulatory Visit: Payer: Medicare Other | Admitting: Internal Medicine

## 2019-05-02 ENCOUNTER — Ambulatory Visit: Payer: Medicare Other | Admitting: Internal Medicine

## 2019-07-10 ENCOUNTER — Ambulatory Visit: Payer: Medicare Other

## 2019-07-17 ENCOUNTER — Ambulatory Visit: Payer: Medicare Other

## 2019-07-18 ENCOUNTER — Ambulatory Visit: Payer: Medicare Other | Attending: Internal Medicine

## 2019-07-31 ENCOUNTER — Ambulatory Visit: Payer: Medicare Other | Attending: Internal Medicine

## 2019-07-31 DIAGNOSIS — Z23 Encounter for immunization: Secondary | ICD-10-CM

## 2019-07-31 NOTE — Progress Notes (Signed)
   Covid-19 Vaccination Clinic  Name:  Jordan Barron    MRN: [id number] DOB: Jan 11, 1951  07/31/2019  [name] was observed post Covid-19 immunization for 15 minutes without incident. She was provided with Vaccine Information Sheet and instruction to access the V-Safe system.   [name] was instructed to call 911 with any severe reactions post vaccine: Marland Kitchen Difficulty breathing  . Swelling of face and throat  . A fast heartbeat  . A bad rash all over body  . Dizziness and weakness   Immunizations Administered    Name Date Dose VIS Date Route   Pfizer COVID-19 Vaccine 07/31/2019  8:16 AM 0.3 mL 06/04/2018 Intramuscular   Manufacturer: [employer], [employer]   Lot: [id number]   NDC: [address]-6

## 2019-08-26 ENCOUNTER — Ambulatory Visit: Payer: Medicare Other | Attending: Internal Medicine

## 2019-08-26 DIAGNOSIS — Z23 Encounter for immunization: Secondary | ICD-10-CM

## 2019-08-26 NOTE — Progress Notes (Signed)
   Covid-19 Vaccination Clinic  Name:  Jordan Barron    MRN: [id number] DOB: 1950-05-30  08/26/2019  [name] was observed post Covid-19 immunization for 15 minutes without incident. She was provided with Vaccine Information Sheet and instruction to access the V-Safe system.   Ms. Common was instructed to call 911 with any severe reactions post vaccine: Marland Kitchen Difficulty breathing  . Swelling of face and throat  . A fast heartbeat  . A bad rash all over body  . Dizziness and weakness   Immunizations Administered    Name Date Dose VIS Date Route   Pfizer COVID-19 Vaccine 08/26/2019  8:08 AM 0.3 mL 06/04/2018 Intramuscular   Manufacturer: [employer], [employer]   Lot: [id number]   NDC: [address]-1

## 2019-09-01 ENCOUNTER — Encounter: Payer: Self-pay | Admitting: Physician Assistant

## 2019-09-01 NOTE — Progress Notes (Signed)
Cardiology Office Note    Date:  09/03/2019   ID:  Jordan Barron, DOB 12-23-1950, MRN [id number]  PCP:  [facility]  Cardiologist:  Nelva Bush, MD  Electrophysiologist:  None   Chief Complaint: Follow up  History of Present Illness:   Jordan Barron is a 69 y.o. female with history of paroxysmal SVT and HTN who presents for follow-up.  She was previously evaluated by Dr. Mare Ferrari for SVT in 09/2014 with recommendation to initiate metoprolol succinate 12.5 mg daily and proceed with Holter monitoring and echo.  Echo performed at that time was normal as outlined below.  It does not appear Holter monitor was completed.  More recently, she was seen in the [facility], ED in 05/2017 with palpitations and SOB.  EKG showed SVT, which was successfully terminated with 6 mg of IV adenosine x1 with resolution in symptoms.  This was her third episode of SVT, and first since her visit in 2016.  She was last seen in the office in 06/2017 and was doing well from a cardiac perspective with some mild swelling noted in her feet.  She did note she had been fairly sedentary.  She was continued on Toprol-XL 25 mg daily with resting heart rate in the 60s precluding further titration.  She was also provided a prescription for Lopressor 25 mg twice daily as needed for palpitations.  She comes in doing very well from a cardiac perspective.  Since she was last seen she did have 1 episode of tachypalpitations in 2020 which were successfully terminated with vagal maneuvers.  She did not take any as needed Lopressor.  She reports a longstanding history of whitecoat hypertension with BP typically elevated at medical offices.  In this setting, her lisinopril was titrated to 40 mg daily with continuation of Toprol-XL.  With this, she noted symptoms of orthostasis leading her lisinopril to be decreased back down to 20 mg daily.  BP at home is typically in the 419F to 790W systolic.  She denies any chest  pain, shortness of breath, palpitations, dizziness, presyncope, syncope, lower extremity swelling, abdominal distention, orthopnea, PND, or early satiety.  Today, her weight is down 2 pounds when compared to her visit in 06/2017.  She will be traveling to [place] in the end of June, 2021 to visit with her mother who is in poor health.  The patient has received both of her Covid 19 vaccines.   Labs independently reviewed: 05/2017 - magnesium 2.0, potassium 3.9, BUN 15, serum creatinine 0.88, Hgb 14.3, PLT 355  Past Medical History:  Diagnosis Date  . Hypertension   . Paroxysmal SVT (supraventricular tachycardia) (HCC)     Past Surgical History:  Procedure Laterality Date  . ABDOMINAL HYSTERECTOMY      Current Medications: Current Meds  Medication Sig  . aspirin EC 81 MG tablet Take 81 mg by mouth daily. Taking 1 tablet daily  . atorvastatin (LIPITOR) 20 MG tablet Take 20 mg by mouth daily. Taking 1 tablet daily  . lisinopril (PRINIVIL,ZESTRIL) 20 MG tablet Take 20 mg by mouth daily.  Marland Kitchen loratadine (CLARITIN) 10 MG tablet Take 10 mg by mouth daily as needed for allergies.   . metoprolol tartrate (LOPRESSOR) 25 MG tablet Take 1 tablet (25 mg total) by mouth 2 (two) times daily.  . Multiple Vitamin (MULTIVITAMIN) capsule Take 1 capsule by mouth daily.  . pantoprazole (PROTONIX) 20 MG tablet Take 20 mg by mouth daily as needed for heartburn or  indigestion.     Allergies:   Patient has no known allergies.   Social History   Socioeconomic History  . Marital status: Single    Spouse name: Not on file  . Number of children: Not on file  . Years of education: Not on file  . Highest education level: Not on file  Occupational History  . Not on file  Tobacco Use  . Smoking status: Never Smoker  . Smokeless tobacco: Never Used  Substance and Sexual Activity  . Alcohol use: No  . Drug use: No  . Sexual activity: Not on file  Other Topics Concern  . Not on file  Social History Narrative   . Not on file   Social Determinants of Health   Financial Resource Strain:   . Difficulty of Paying Living Expenses:   Food Insecurity:   . Worried About Programme researcher, broadcasting/film/video in the Last Year:   . Barista in the Last Year:   Transportation Needs:   . Freight forwarder (Medical):   Marland Kitchen Lack of Transportation (Non-Medical):   Physical Activity:   . Days of Exercise per Week:   . Minutes of Exercise per Session:   Stress:   . Feeling of Stress :   Social Connections:   . Frequency of Communication with Friends and Family:   . Frequency of Social Gatherings with Friends and Family:   . Attends Religious Services:   . Active Member of Clubs or Organizations:   . Attends Banker Meetings:   Marland Kitchen Marital Status:      Family History:  The patient's family history includes Other in her mother.  ROS:   Review of Systems  Constitutional: Negative for chills, diaphoresis, fever, malaise/fatigue and weight loss.  HENT: Negative for congestion.   Eyes: Negative for discharge and redness.  Respiratory: Negative for cough, hemoptysis, sputum production, shortness of breath and wheezing.   Cardiovascular: Positive for palpitations. Negative for chest pain, orthopnea, claudication, leg swelling and PND.  Gastrointestinal: Negative for abdominal pain, blood in stool, heartburn, melena, nausea and vomiting.  Genitourinary: Negative for hematuria.  Musculoskeletal: Negative for falls and myalgias.  Skin: Negative for rash.  Neurological: Negative for dizziness, tingling, tremors, sensory change, speech change, focal weakness, loss of consciousness and weakness.  Endo/Heme/Allergies: Does not bruise/bleed easily.  Psychiatric/Behavioral: Negative for substance abuse. The patient is not nervous/anxious.   All other systems reviewed and are negative.    EKGs/Labs/Other Studies Reviewed:    Studies reviewed were summarized above. The additional studies were reviewed  today:  2D echo 09/2014: - Left ventricle: The cavity size was normal. Wall thickness was  normal. The estimated ejection fraction was 65%. Wall motion was  normal; there were no regional wall motion abnormalities.  - Right ventricle: The cavity size was normal. Systolic function  was normal. __________  Nuclear stress test 12/2008: No significant stress-induced defects, EF 69% with normal wall motion Negative scan   EKG:  EKG is ordered today.  The EKG ordered today demonstrates NSR, 69 bpm, left anterior fascicular block, LVH with nonspecific IVCD, poor R wave progression along the precordial leads, no acute ST-T changes, grossly unchanged when compared to prior  Recent Labs: No results found for requested labs within last 8760 hours.  Recent Lipid Panel No results found for: CHOL, TRIG, HDL, CHOLHDL, VLDL, LDLCALC, LDLDIRECT  PHYSICAL EXAM:    VS:  BP (!) 190/94 (BP Location: Left Arm, Patient Position: Sitting, Cuff  Size: Normal)   Pulse 69   Ht 5\' 10"  (1.778 m)   Wt 228 lb (103.4 kg)   SpO2 98%   BMI 32.71 kg/m   BMI: Body mass index is 32.71 kg/m.  Physical Exam  Constitutional: She is oriented to person, place, and time. She appears well-developed and well-nourished.  HENT:  Head: Normocephalic and atraumatic.  Eyes: Right eye exhibits no discharge. Left eye exhibits no discharge.  Neck: No JVD present.  Cardiovascular: Normal rate, regular rhythm, S1 normal and S2 normal. Exam reveals no distant heart sounds, no friction rub, no midsystolic click and no opening snap.  Murmur heard. Pulses:      Posterior tibial pulses are 2+ on the right side and 2+ on the left side.  I/VI systolic murmur in the RUSB.  Pulmonary/Chest: Effort normal and breath sounds normal. No respiratory distress. She has no decreased breath sounds. She has no wheezes. She has no rales. She exhibits no tenderness.  Abdominal: Soft. She exhibits no distension. There is no abdominal  tenderness.  Musculoskeletal:        General: No edema.     Cervical back: Normal range of motion.  Neurological: She is alert and oriented to person, place, and time.  Skin: Skin is warm and dry. No cyanosis. Nails show no clubbing.  Psychiatric: She has a normal mood and affect. Her speech is normal and behavior is normal. Judgment and thought content normal.    Wt Readings from Last 3 Encounters:  09/03/19 228 lb (103.4 kg)  06/14/17 230 lb 12.8 oz (104.7 kg)  05/10/15 226 lb (102.5 kg)     ASSESSMENT & PLAN:   1. Paroxysmal SVT: She has done well since she was last seen.  She had one brief episode of tachypalpitations in 2020 in which she was able to terminate with vagal maneuvers and did not require ED evaluation.  She did not take any as needed Lopressor.  She remains on Toprol-XL 25 mg once daily.  Continue Toprol-XL 25 mg once daily and as needed Lopressor 25 mg twice daily as needed sustained tachypalpitations.  Should she have increased tachycardia palpitation burden moving forward would consider EP referral for consideration of SVT ablation.  2. HTN: Blood pressure is typically high at medical office visits.  BP at home has been running in the 120s to 140s systolic.  In this setting, lisinopril was recently titrated to 40 mg daily though she experienced symptoms of orthostasis with this and this has subsequently been decreased back down to 20 mg daily.  She will continue this along with Toprol-XL as outlined above.  Low-sodium diet recommended.  3. Murmur: Asymptomatic.  Schedule echo.  Disposition: F/u with Dr. 2021 or an APP in 4 to 6 weeks to discuss follow-up echo.   Medication Adjustments/Labs and Tests Ordered: Current medicines are reviewed at length with the patient today.  Concerns regarding medicines are outlined above. Medication changes, Labs and Tests ordered today are summarized above and listed in the Patient Instructions accessible in Encounters.   Signed, Okey Dupre, PA-C 09/03/2019 8:49 AM     [facility] [place] [address] [place], [place] [phone]

## 2019-09-03 ENCOUNTER — Encounter: Payer: Self-pay | Admitting: Physician Assistant

## 2019-09-03 ENCOUNTER — Other Ambulatory Visit: Payer: Self-pay

## 2019-09-03 ENCOUNTER — Ambulatory Visit (INDEPENDENT_AMBULATORY_CARE_PROVIDER_SITE_OTHER): Payer: Medicare Other | Admitting: Physician Assistant

## 2019-09-03 VITALS — BP 190/94 | HR 69 | Ht 70.0 in | Wt 228.0 lb

## 2019-09-03 DIAGNOSIS — I471 Supraventricular tachycardia: Secondary | ICD-10-CM

## 2019-09-03 DIAGNOSIS — I10 Essential (primary) hypertension: Secondary | ICD-10-CM

## 2019-09-03 DIAGNOSIS — R011 Cardiac murmur, unspecified: Secondary | ICD-10-CM

## 2019-09-03 MED ORDER — METOPROLOL TARTRATE 25 MG PO TABS: 25.0000 mg | ORAL_TABLET | Freq: Two times a day (BID) | ORAL | 3 refills | Status: DC | PRN

## 2019-09-03 NOTE — Patient Instructions (Signed)
Medication Instructions:   Take your Metoprolol Tartrate (Lopressor) 25mg  only when needed, when heart rate is at 110 or greater.  Take your Metoprolol Succinate (Toprol XL) 25 mg one time everyday.   *If you need a refill on your cardiac medications before your next appointment, please call your pharmacy*   Lab Work: No labs ordered. If you have labs (blood work) drawn today and your tests are completely normal, you will receive your results only by: Marland Kitchen MyChart Message (if you have MyChart) OR . A paper copy in the mail If you have any lab test that is abnormal or we need to change your treatment, we will call you to review the results.   Testing/Procedures: Your physician has requested that you have an echocardiogram. Echocardiography is a painless test that uses sound waves to create images of your heart. It provides your doctor with information about the size and shape of your heart and how well your heart's chambers and valves are working. This procedure takes approximately one hour. There are no restrictions for this procedure.    Follow-Up: At [facility], you and your health needs are our priority.  As part of our continuing mission to provide you with exceptional heart care, we have created designated Provider Care Teams.  These Care Teams include your primary Cardiologist (physician) and Advanced Practice Providers (APPs -  Physician Assistants and Nurse Practitioners) who all work together to provide you with the care you need, when you need it.  We recommend signing up for the patient portal called "MyChart".  Sign up information is provided on this After Visit Summary.  MyChart is used to connect with patients for Virtual Visits (Telemedicine).  Patients are able to view lab/test results, encounter notes, upcoming appointments, etc.  Non-urgent messages can be sent to your provider as well.   To learn more about what you can do with MyChart, go to [email or web address].      Your next appointment:   4-6 week(s)  The format for your next appointment:   In Person  Provider:   Dr. Ernie Avena, PA-C   Other Instructions   Echocardiogram An echocardiogram is a procedure that uses painless sound waves (ultrasound) to produce an image of the heart. Images from an echocardiogram can provide important information about:  Signs of coronary artery disease (CAD).  Aneurysm detection. An aneurysm is a weak or damaged part of an artery wall that bulges out from the normal force of blood pumping through the body.  Heart size and shape. Changes in the size or shape of the heart can be associated with certain conditions, including heart failure, aneurysm, and CAD.  Heart muscle function.  Heart valve function.  Signs of a past heart attack.  Fluid buildup around the heart.  Thickening of the heart muscle.  A tumor or infectious growth around the heart valves. Tell a health care provider about:  Any allergies you have.  All medicines you are taking, including vitamins, herbs, eye drops, creams, and over-the-counter medicines.  Any blood disorders you have.  Any surgeries you have had.  Any medical conditions you have.  Whether you are pregnant or may be pregnant. What are the risks? Generally, this is a safe procedure. However, problems may occur, including:  Allergic reaction to dye (contrast) that may be used during the procedure. What happens before the procedure? No specific preparation is needed. You may eat and drink normally. What happens during the procedure?   An IV tube  may be inserted into one of your veins.  You may receive contrast through this tube. A contrast is an injection that improves the quality of the pictures from your heart.  A gel will be applied to your chest.  A wand-like tool (transducer) will be moved over your chest. The gel will help to transmit the sound waves from the transducer.  The sound waves will  harmlessly bounce off of your heart to allow the heart images to be captured in real-time motion. The images will be recorded on a computer. The procedure may vary among health care providers and hospitals. What happens after the procedure?  You may return to your normal, everyday life, including diet, activities, and medicines, unless your health care provider tells you not to do that. Summary  An echocardiogram is a procedure that uses painless sound waves (ultrasound) to produce an image of the heart.  Images from an echocardiogram can provide important information about the size and shape of your heart, heart muscle function, heart valve function, and fluid buildup around your heart.  You do not need to do anything to prepare before this procedure. You may eat and drink normally.  After the echocardiogram is completed, you may return to your normal, everyday life, unless your health care provider tells you not to do that. This information is not intended to replace advice given to you by your health care provider. Make sure you discuss any questions you have with your health care provider. Document Revised: 07/18/2018 Document Reviewed: 04/29/2016 Elsevier Patient Education  2020 [employer].

## 2019-09-09 ENCOUNTER — Other Ambulatory Visit: Payer: Self-pay | Admitting: Internal Medicine

## 2019-09-09 MED ORDER — METOPROLOL TARTRATE 25 MG PO TABS: 25.0000 mg | ORAL_TABLET | Freq: Two times a day (BID) | ORAL | 0 refills | Status: DC | PRN

## 2019-09-09 NOTE — Telephone Encounter (Signed)
*  STAT* If patient is at the pharmacy, call can be transferred to refill team.   1. Which medications need to be refilled? (please list name of each medication and dose if known) metoprolol tartrate   2. Which pharmacy/location (including street and city if local pharmacy) is medication to be sent to? [facility] on [address] in [place]   3. Do they need a 30 day or 90 day supply? 90 day  States she talked to [facility] and they did not have - please resend.

## 2019-09-09 NOTE — Telephone Encounter (Signed)
Requested Prescriptions   Signed Prescriptions Disp Refills   metoprolol tartrate (LOPRESSOR) 25 MG tablet 180 tablet 0    Sig: Take 1 tablet (25 mg total) by mouth 2 (two) times daily as needed. When heart rate remains elevated at 110 or greater.    Authorizing Provider: Sondra Barges    Ordering User: Thayer Headings, Jamiee Milholland L

## 2019-09-24 ENCOUNTER — Ambulatory Visit: Payer: Self-pay

## 2019-11-04 ENCOUNTER — Other Ambulatory Visit: Payer: Medicare Other

## 2019-11-14 ENCOUNTER — Ambulatory Visit (INDEPENDENT_AMBULATORY_CARE_PROVIDER_SITE_OTHER): Payer: Medicare Other

## 2019-11-14 ENCOUNTER — Other Ambulatory Visit: Payer: Self-pay

## 2019-11-14 DIAGNOSIS — R011 Cardiac murmur, unspecified: Secondary | ICD-10-CM

## 2019-11-14 LAB — ECHOCARDIOGRAM COMPLETE
AR max vel: 3.43 cm2
AV Area VTI: 3.32 cm2
AV Area mean vel: 3.23 cm2
AV Mean grad: 3 mmHg
AV Peak grad: 6 mmHg
Ao pk vel: 1.22 m/s
Area-P 1/2: 3.48 cm2
Calc EF: 52.5 %
S' Lateral: 3.4 cm
Single Plane A2C EF: 49.4 %
Single Plane A4C EF: 54.4 %

## 2019-11-17 ENCOUNTER — Telehealth: Payer: Self-pay

## 2019-11-17 NOTE — Telephone Encounter (Signed)
Attempted to call patient. LMTCB 11/17/2019   

## 2019-11-17 NOTE — Telephone Encounter (Signed)
-----   Message from Sondra Barges, PA-C sent at 11/15/2019  1:40 PM EDT ----- Echo showed normal pump function, normal wall motion, slightly stiffened heart, mildly thickened aortic valve without narrowing  Recommendations: -Optimal blood pressure control. Her blood pressure was elevated at her last visit, though she reported this is typically well controlled in the 120s to 140s systolic at home. Please have her bring her BP cuff to her next appointment so we can compare this to ours and ensure her readings are accurate. She noted symptoms of orthostasis with prior titration of antihypertensive medications, so in this setting, we will defer changes at this time until we can compare her BP cuff to ours. Follow up as planned.

## 2019-11-17 NOTE — Telephone Encounter (Signed)
Call to patient to review echo results.    Pt verbalized understanding and has no further questions at this time.    Advised pt to call for any further questions or concerns.  No further orders.  Confirmed appt next week.  

## 2019-11-24 NOTE — Progress Notes (Signed)
Cardiology Office Note    Date:  11/28/2019   ID:  Jordan, Barron Feb 23, 1951, MRN [id number]  PCP:  [facility]  Cardiologist:  Yvonne Kendall, MD  Electrophysiologist:  None   Chief Complaint: Follow up  History of Present Illness:   Jordan Barron is a 69 y.o. female with history of paroxysmal SVT and HTN who presents for follow-up.  She was previously evaluated by Dr. Patty Sermons for SVT in 09/2014 with recommendation to initiate metoprolol succinate 12.5 mg daily and proceed with Holter monitoring and echo.  Echo performed at that time was normal as outlined below.  It does not appear Holter monitor was completed.  She was seen in the [facility], ED in 05/2017 with palpitations and SOB.  EKG showed SVT, which was successfully terminated with 6 mg of IV adenosine x1 with resolution in symptoms.  This was her third episode of SVT, and first since her visit in 2016.  She was seen in the office in 06/2017 and was doing well from a cardiac perspective with some mild swelling noted in her feet.  She did note she had been fairly sedentary.  She was continued on Toprol-XL 25 mg daily with resting heart rate in the 60s precluding further titration.  She was also provided a prescription for Lopressor 25 mg twice daily as needed for palpitations.  She was last seen in the office in 08/2019 for routine follow up and was doing well, having noted only 1 episode of tachypalpitations since she was seen prior to that visit.  Her BP was elevated, though reported a long history of white coat hypertension.  She underwent echo in 11/2019 for evaluation of a murmur noted on exam that showed an EF of 55-60%, no RWMA, Gr2DD, RVSF and ventricular cavity size normal, aortic valve sclerosis without stenosis was noted.   She comes in doing very well from a cardiac perspective.  No chest pain, dyspnea, palpitations, dizziness, presyncope, syncope, lower extremity swelling, abdominal distention,  orthopnea, PND, early satiety.  Since she was last seen she has not had any tachypalpitations and has not needed any as needed Lopressor.  She brings in her blood pressure cuff for review today with most readings in the 120s to 140s mmHg systolic.  Since she was last seen her PCP did increase her lisinopril to 30 mg daily.  She is tolerating this without issues.  She was unable to go visit with her mother in [place] secondary to Covid numbers.  She is hoping to revisit this trip in December, 2021.  She does not have any issues or concerns at this time.   Labs independently reviewed: 05/2017 - magnesium 2.0, potassium 3.9, BUN 15, serum creatinine 0.88, Hgb 14.3, PLT 355   Past Medical History:  Diagnosis Date  . Hypertension   . Paroxysmal SVT (supraventricular tachycardia) (HCC)     Past Surgical History:  Procedure Laterality Date  . ABDOMINAL HYSTERECTOMY      Current Medications: Current Meds  Medication Sig  . aspirin EC 81 MG tablet Take 81 mg by mouth daily. Taking 1 tablet daily  . lisinopril (ZESTRIL) 30 MG tablet lisinopril 30 mg tablet  TAKE 1 TABLET BY MOUTH ONCE DAILY FOR 90 DAYS  . loratadine (CLARITIN) 10 MG tablet Take 10 mg by mouth daily as needed for allergies.   Marland Kitchen METOPROLOL SUCCINATE PO Take 25 mg by mouth every morning.  . metoprolol tartrate (LOPRESSOR) 25 MG tablet Take 1  tablet (25 mg total) by mouth 2 (two) times daily as needed. When heart rate remains elevated at 110 or greater.  . Multiple Vitamin (MULTIVITAMIN) capsule Take 1 capsule by mouth daily.  . pantoprazole (PROTONIX) 20 MG tablet Take 20 mg by mouth daily as needed for heartburn or indigestion.   . [DISCONTINUED] metoprolol succinate (TOPROL-XL) 25 MG 24 hr tablet Take 25 mg by mouth daily.    Allergies:   Patient has no known allergies.   Social History   Socioeconomic History  . Marital status: Single    Spouse name: Not on file  . Number of children: Not on file  . Years of education:  Not on file  . Highest education level: Not on file  Occupational History  . Not on file  Tobacco Use  . Smoking status: Never Smoker  . Smokeless tobacco: Never Used  Substance and Sexual Activity  . Alcohol use: No  . Drug use: No  . Sexual activity: Not on file  Other Topics Concern  . Not on file  Social History Narrative  . Not on file   Social Determinants of Health   Financial Resource Strain:   . Difficulty of Paying Living Expenses: Not on file  Food Insecurity:   . Worried About Programme researcher, broadcasting/film/video in the Last Year: Not on file  . Ran Out of Food in the Last Year: Not on file  Transportation Needs:   . Lack of Transportation (Medical): Not on file  . Lack of Transportation (Non-Medical): Not on file  Physical Activity:   . Days of Exercise per Week: Not on file  . Minutes of Exercise per Session: Not on file  Stress:   . Feeling of Stress : Not on file  Social Connections:   . Frequency of Communication with Friends and Family: Not on file  . Frequency of Social Gatherings with Friends and Family: Not on file  . Attends Religious Services: Not on file  . Active Member of Clubs or Organizations: Not on file  . Attends Banker Meetings: Not on file  . Marital Status: Not on file     Family History:  The patient's family history includes Other in her mother.  ROS:   Review of Systems  Constitutional: Negative for chills, diaphoresis, fever, malaise/fatigue and weight loss.  HENT: Negative for congestion.   Eyes: Negative for discharge and redness.  Respiratory: Negative for cough, sputum production, shortness of breath and wheezing.   Cardiovascular: Negative for chest pain, palpitations, orthopnea, claudication, leg swelling and PND.  Gastrointestinal: Negative for abdominal pain, heartburn, nausea and vomiting.  Musculoskeletal: Negative for falls and myalgias.  Skin: Negative for rash.  Neurological: Negative for dizziness, tingling,  tremors, sensory change, speech change, focal weakness, loss of consciousness and weakness.  Endo/Heme/Allergies: Does not bruise/bleed easily.  Psychiatric/Behavioral: Negative for substance abuse. The patient is not nervous/anxious.   All other systems reviewed and are negative.    EKGs/Labs/Other Studies Reviewed:    Studies reviewed were summarized above. The additional studies were reviewed today:  2D echo 11/14/2019: 1. Left ventricular ejection fraction, by estimation, is 55 to 60%. The  left ventricle has normal function. The left ventricle has no regional  wall motion abnormalities. Left ventricular diastolic parameters are  consistent with Grade II diastolic  dysfunction (pseudonormalization).  2. Right ventricular systolic function is normal. The right ventricular  size is normal.  3. The mitral valve is normal in structure. No evidence of  mitral valve  regurgitation. No evidence of mitral stenosis.  4. The aortic valve is tricuspid. Aortic valve regurgitation is not  visualized. Mild aortic valve sclerosis is present, with no evidence of  aortic valve stenosis.  5. The inferior vena cava is normal in size with greater than 50%  respiratory variability, suggesting right atrial pressure of 3 mmHg. __________  2D echo 09/2014: - Left ventricle: The cavity size was normal. Wall thickness was  normal. The estimated ejection fraction was 65%. Wall motion was  normal; there were no regional wall motion abnormalities.  - Right ventricle: The cavity size was normal. Systolic function  was normal. __________  Nuclear stress test 12/2008: No significant stress-induced defects, EF 69% with normal wall motion Negative scan   EKG:  EKG is ordered today.  The EKG ordered today demonstrates sinus bradycardia, 58 bpm, left anterior fascicular block, LVH, poor R wave progression along the precordial leads, no acute ST-T changes  Recent Labs: No results found for requested  labs within last 8760 hours.  Recent Lipid Panel No results found for: CHOL, TRIG, HDL, CHOLHDL, VLDL, LDLCALC, LDLDIRECT  PHYSICAL EXAM:    VS:  BP (!) 190/90 (BP Location: Left Arm, Patient Position: Sitting, Cuff Size: Normal)   Pulse (!) 58   Ht 5\' 10"  (1.778 m)   Wt 225 lb 3.2 oz (102.2 kg)   SpO2 98%   BMI 32.31 kg/m   BMI: Body mass index is 32.31 kg/m.  Physical Exam Constitutional:      Appearance: She is well-developed.  HENT:     Head: Normocephalic and atraumatic.  Eyes:     General:        Right eye: No discharge.        Left eye: No discharge.  Neck:     Vascular: No JVD.  Cardiovascular:     Rate and Rhythm: Normal rate and regular rhythm.     Pulses: No midsystolic click and no opening snap.          Dorsalis pedis pulses are 2+ on the right side and 2+ on the left side.       Posterior tibial pulses are 2+ on the right side and 2+ on the left side.     Heart sounds: S1 normal and S2 normal. Heart sounds not distant. Murmur heard.  Systolic murmur is present with a grade of 1/6 at the upper right sternal border.  No friction rub.  Pulmonary:     Effort: Pulmonary effort is normal. No respiratory distress.     Breath sounds: Normal breath sounds. No decreased breath sounds, wheezing or rales.  Chest:     Chest wall: No tenderness.  Abdominal:     General: There is no distension.     Palpations: Abdomen is soft.     Tenderness: There is no abdominal tenderness.  Musculoskeletal:     Cervical back: Normal range of motion.  Skin:    General: Skin is warm and dry.     Nails: There is no clubbing.  Neurological:     Mental Status: She is alert and oriented to person, place, and time.  Psychiatric:        Speech: Speech normal.        Behavior: Behavior normal.        Thought Content: Thought content normal.        Judgment: Judgment normal.     Wt Readings from Last 3 Encounters:  11/28/19 225 lb 3.2  oz (102.2 kg)  09/03/19 228 lb (103.4 kg)    06/14/17 230 lb 12.8 oz (104.7 kg)     ASSESSMENT & PLAN:   1. Paroxysmal SVT: She is doing well without any symptoms concerning for tachypalpitations since she was last seen.  She has not needed any as needed Lopressor.  Continue standing Toprol-XL 25 mg daily along with as needed Lopressor for tachypalpitations.  She is mildly bradycardic in the office today though asymptomatic.  Should she have increased tachycardic/palpitation burden moving forward, consider referral to EP for possible SVT ablation.  2. HTN: Blood pressure is again elevated this morning.  This is typically elevated at medical office visits with BP at home running in the 120s to 140s mmHg systolic.  Since she was last seen PCP has titrated her lisinopril to 30 mg daily and she is tolerating this without issues.  She will otherwise continue metoprolol 25 mg daily.  Bradycardic heart rate precludes further escalation at this time.  She has previously not tolerated titration of lisinopril to 40 mg with symptoms of orthostasis leading to de-escalation of this medication.  Low-sodium diet recommended.  3. Aortic valve sclerosis: Echo earlier this month demonstrated aortic valve sclerosis without evidence of stenosis.  No further work-up at this time.  Disposition: F/u with Dr. Okey DupreEnd or an APP in 6 months, sooner if needed.   Medication Adjustments/Labs and Tests Ordered: Current medicines are reviewed at length with the patient today.  Concerns regarding medicines are outlined above. Medication changes, Labs and Tests ordered today are summarized above and listed in the Patient Instructions accessible in Encounters.   Signed, Eula Listenyan Alvetta Hidrogo, PA-C 11/28/2019 8:17 AM     [facility] [address] [place], [place] [address] [phone]

## 2019-11-28 ENCOUNTER — Encounter: Payer: Self-pay | Admitting: Physician Assistant

## 2019-11-28 ENCOUNTER — Other Ambulatory Visit: Payer: Self-pay

## 2019-11-28 ENCOUNTER — Ambulatory Visit (INDEPENDENT_AMBULATORY_CARE_PROVIDER_SITE_OTHER): Payer: Medicare Other | Admitting: Physician Assistant

## 2019-11-28 VITALS — BP 190/90 | HR 58 | Ht 70.0 in | Wt 225.2 lb

## 2019-11-28 DIAGNOSIS — I358 Other nonrheumatic aortic valve disorders: Secondary | ICD-10-CM

## 2019-11-28 DIAGNOSIS — I471 Supraventricular tachycardia: Secondary | ICD-10-CM

## 2019-11-28 DIAGNOSIS — I10 Essential (primary) hypertension: Secondary | ICD-10-CM | POA: Diagnosis not present

## 2019-11-28 NOTE — Patient Instructions (Signed)
Medication Instructions:  Your physician recommends that you continue on your current medications as directed. Please refer to the Current Medication list given to you today.  *If you need a refill on your cardiac medications before your next appointment, please call your pharmacy*   Lab Work: None ordered  If you have labs (blood work) drawn today and your tests are completely normal, you will receive your results only by: . MyChart Message (if you have MyChart) OR . A paper copy in the mail If you have any lab test that is abnormal or we need to change your treatment, we will call you to review the results.   Testing/Procedures: None ordered    Follow-Up: At [facility], you and your health needs are our priority.  As part of our continuing mission to provide you with exceptional heart care, we have created designated Provider Care Teams.  These Care Teams include your primary Cardiologist (physician) and Advanced Practice Providers (APPs -  Physician Assistants and Nurse Practitioners) who all work together to provide you with the care you need, when you need it.  We recommend signing up for the patient portal called "MyChart".  Sign up information is provided on this After Visit Summary.  MyChart is used to connect with patients for Virtual Visits (Telemedicine).  Patients are able to view lab/test results, encounter notes, upcoming appointments, etc.  Non-urgent messages can be sent to your provider as well.   To learn more about what you can do with MyChart, go to [email or web address].    Your next appointment:   6 months  The format for your next appointment:   In Person  Provider:    You may see Christopher End, MD or Ryan Dunn, PA-C 

## 2020-05-26 ENCOUNTER — Ambulatory Visit: Payer: Medicare Other | Admitting: Internal Medicine

## 2020-06-26 NOTE — Progress Notes (Deleted)
Cardiology Office Note    Date:  06/26/2020   ID:  Jordan Barron, DOB 09/18/1950, MRN [id number]  PCP:  [facility]  Cardiologist:  Yvonne Kendall, MD  Electrophysiologist:  None   Chief Complaint: Follow up  History of Present Illness:   Jordan Barron is a 70 y.o. female with history of paroxysmal SVT and HTN who presents for follow-up.  She was previously evaluated by Dr. Patty Sermons for SVT in 09/2014 with recommendation to initiate metoprolol succinate 12.5 mg daily and proceed with Holter monitoring and echo. Echo performed at that time was normal as outlined below. It does not appear Holter monitor was completed. She was seen in the [facility], ED in 05/2017 with palpitations and SOB. EKG showed SVT, which was successfully terminated with 6 mg of IV adenosine x1 with resolution in symptoms. This was her third episode of SVT, andfirst since her visit in 2016. She was seen in the office in 06/2017 and was doing well from a cardiac perspective with some mild swelling noted in her feet. She did note she had been fairly sedentary. She was continued on Toprol-XL 25 mg daily with resting heart rate in the 60s precluding further titration. She was also provided a prescription for Lopressor 25 mg twice daily as needed for palpitations.  She was seen in the office in 08/2019 for routine follow up and was doing well, having noted only 1 episode of tachypalpitations since she was seen prior to that visit.  Her BP was elevated, though reported a long history of white coat hypertension.  She underwent echo in 11/2019 for evaluation of a murmur noted on exam that showed an EF of 55-60%, no RWMA, Gr2DD, RVSF and ventricular cavity size normal, aortic valve sclerosis without stenosis was noted.  She was last seen in the office in 11/2019 and was doing well from a cardiac perspective.  No changes were indicated.   ***   Labs independently reviewed: 05/2017-magnesium 2.0,  potassium 3.9, BUN 15, serum creatinine 0.88, Hgb 14.3, PLT 355   Past Medical History:  Diagnosis Date  . Hypertension   . Paroxysmal SVT (supraventricular tachycardia) (HCC)     Past Surgical History:  Procedure Laterality Date  . ABDOMINAL HYSTERECTOMY      Current Medications: No outpatient medications have been marked as taking for the 07/02/20 encounter (Appointment) with Sondra Barges, PA-C.    Allergies:   Patient has no known allergies.   Social History   Socioeconomic History  . Marital status: Single    Spouse name: Not on file  . Number of children: Not on file  . Years of education: Not on file  . Highest education level: Not on file  Occupational History  . Not on file  Tobacco Use  . Smoking status: Never Smoker  . Smokeless tobacco: Never Used  Substance and Sexual Activity  . Alcohol use: No  . Drug use: No  . Sexual activity: Not on file  Other Topics Concern  . Not on file  Social History Narrative  . Not on file   Social Determinants of Health   Financial Resource Strain: Not on file  Food Insecurity: Not on file  Transportation Needs: Not on file  Physical Activity: Not on file  Stress: Not on file  Social Connections: Not on file     Family History:  The patient's family history includes Other in her mother.  ROS:   ROS   EKGs/Labs/Other  Studies Reviewed:    Studies reviewed were summarized above. The additional studies were reviewed today:  2D echo 11/14/2019: 1. Left ventricular ejection fraction, by estimation, is 55 to 60%. The  left ventricle has normal function. The left ventricle has no regional  wall motion abnormalities. Left ventricular diastolic parameters are  consistent with Grade II diastolic  dysfunction (pseudonormalization).  2. Right ventricular systolic function is normal. The right ventricular  size is normal.  3. The mitral valve is normal in structure. No evidence of mitral valve  regurgitation. No  evidence of mitral stenosis.  4. The aortic valve is tricuspid. Aortic valve regurgitation is not  visualized. Mild aortic valve sclerosis is present, with no evidence of  aortic valve stenosis.  5. The inferior vena cava is normal in size with greater than 50%  respiratory variability, suggesting right atrial pressure of 3 mmHg. __________  2D echo 09/2014: - Left ventricle: The cavity size was normal. Wall thickness was  normal. The estimated ejection fraction was 65%. Wall motion was  normal; there were no regional wall motion abnormalities.  - Right ventricle: The cavity size was normal. Systolic function  was normal. __________  Nuclear stress test 12/2008: No significant stress-induced defects, EF 69% with normal wall motion Negative scan   EKG:  EKG is ordered today.  The EKG ordered today demonstrates ***  Recent Labs: No results found for requested labs within last 8760 hours.  Recent Lipid Panel No results found for: CHOL, TRIG, HDL, CHOLHDL, VLDL, LDLCALC, LDLDIRECT  PHYSICAL EXAM:    VS:  There were no vitals taken for this visit.  BMI: There is no height or weight on file to calculate BMI.  Physical Exam  Wt Readings from Last 3 Encounters:  11/28/19 225 lb 3.2 oz (102.2 kg)  09/03/19 228 lb (103.4 kg)  06/14/17 230 lb 12.8 oz (104.7 kg)     ASSESSMENT & PLAN:   1. Paroxysmal SVT:  2. HTN: Blood pressure ***  3. Aortic valve sclerosis:  Disposition: F/u with Dr. Okey Dupre or an APP in ***.   Medication Adjustments/Labs and Tests Ordered: Current medicines are reviewed at length with the patient today.  Concerns regarding medicines are outlined above. Medication changes, Labs and Tests ordered today are summarized above and listed in the Patient Instructions accessible in Encounters.   Signed, Eula Listen, PA-C 06/26/2020 12:27 PM     [facility] [place] [address] [place], [place] [address] [phone]

## 2020-07-02 ENCOUNTER — Ambulatory Visit: Payer: Medicare Other | Admitting: Physician Assistant

## 2020-07-09 NOTE — Progress Notes (Signed)
Cardiology Office Note    Date:  07/14/2020   ID:  Jordan Barron, DOB 1950-10-13, MRN [id number]  PCP:  [facility]  Cardiologist:  Yvonne Kendall, MD  Electrophysiologist:  None   Chief Complaint: Follow-up  History of Present Illness:   Jordan Barron is a 70 y.o. female with history of paroxysmal SVT and HTN who presents for follow-up.  She was previously evaluated by Dr. Patty Sermons for SVT in 09/2014 with recommendation to initiate metoprolol succinate 12.5 mg daily and proceed with Holter monitoring and echo. Echo performed at that time was normal as outlined below. It does not appear Holter monitor was completed. She was seen in the [facility], ED in 05/2017 with palpitations and SOB. EKG showed SVT, which was successfully terminated with 6 mg of IV adenosine x1 with resolution in symptoms. This was her third episode of SVT, andfirst since her visit in 2016. She was seen in the office in 06/2017 and was doing well from a cardiac perspective with some mild swelling noted in her feet. She did note she had been fairly sedentary. She was continued on Toprol-XL 25 mg daily with resting heart rate in the 60s precluding further titration. She was also provided a prescription for Lopressor 25 mg twice daily as needed for palpitations.  She was seen in the office in 08/2019 for routine follow up and was doing well, having noted only 1 episode of tachypalpitations since she was seen prior to that visit.  Her BP was elevated, though reported a long history of white coat hypertension.  She underwent echo in 11/2019 for evaluation of a murmur noted on exam that showed an EF of 55-60%, no RWMA, Gr2DD, RVSF and ventricular cavity size normal, aortic valve sclerosis without stenosis was noted.  She was last seen in the office in 11/2019 and was doing well from a cardiac perspective.  No changes were indicated.  BP remained elevated, suspected to be in the setting of whitecoat  hypertension.  She comes in doing well from a cardiac perspective.  She has not had any tachypalpitations since her last visit.  Her weight is down 10 pounds when compared to her last clinic visit in 11/2019.  She did travel back to [place] to visit with her mother in 04/2020.  Following this trip she had several weeks of increased fatigue with work-up with her PCP being unrevealing.  This fatigue has subsequently improved and she feels back to baseline.  Home BP readings are predominantly in the 140s with occasional readings in the 130s to 150s systolic.  She did bring her BP cuff today for comparison.  Upon bringing this out it was noted she has a wrist sphygmomanometer.  No chest pain, dyspnea, dizziness, presyncope, syncope, lower extremity swelling, abdominal distention, orthopnea, PND, or early satiety.  She has been advised to start atorvastatin by her PCP though has not yet done so.   Labs independently reviewed: 05/2017-magnesium 2.0, potassium 3.9, BUN 15, serum creatinine 0.88, Hgb 14.3, PLT 355   Past Medical History:  Diagnosis Date  . Hypertension   . Paroxysmal SVT (supraventricular tachycardia) (HCC)     Past Surgical History:  Procedure Laterality Date  . ABDOMINAL HYSTERECTOMY      Current Medications: Current Meds  Medication Sig  . aspirin EC 81 MG tablet Take 81 mg by mouth daily. Taking 1 tablet daily  . lisinopril (ZESTRIL) 30 MG tablet lisinopril 30 mg tablet  TAKE 1 TABLET BY  MOUTH ONCE DAILY FOR 90 DAYS  . loratadine (CLARITIN) 10 MG tablet Take 10 mg by mouth daily as needed for allergies.   Marland Kitchen METOPROLOL SUCCINATE PO Take 25 mg by mouth every morning.  . metoprolol tartrate (LOPRESSOR) 25 MG tablet Take 1 tablet (25 mg total) by mouth 2 (two) times daily as needed. When heart rate remains elevated at 110 or greater.  . Multiple Vitamin (MULTIVITAMIN) capsule Take 1 capsule by mouth daily.  . pantoprazole (PROTONIX) 20 MG tablet Take 20 mg by mouth daily as  needed for heartburn or indigestion.     Allergies:   Patient has no known allergies.   Social History   Socioeconomic History  . Marital status: Single    Spouse name: Not on file  . Number of children: Not on file  . Years of education: Not on file  . Highest education level: Not on file  Occupational History  . Not on file  Tobacco Use  . Smoking status: Never Smoker  . Smokeless tobacco: Never Used  Substance and Sexual Activity  . Alcohol use: No  . Drug use: No  . Sexual activity: Not on file  Other Topics Concern  . Not on file  Social History Narrative  . Not on file   Social Determinants of Health   Financial Resource Strain: Not on file  Food Insecurity: Not on file  Transportation Needs: Not on file  Physical Activity: Not on file  Stress: Not on file  Social Connections: Not on file     Family History:  The patient's family history includes Other in her mother.  ROS:   Review of Systems  Constitutional: Negative for chills, diaphoresis, fever, malaise/fatigue and weight loss.  HENT: Negative for congestion.   Eyes: Negative for discharge and redness.  Respiratory: Negative for cough, sputum production, shortness of breath and wheezing.   Cardiovascular: Negative for chest pain, palpitations, orthopnea, claudication, leg swelling and PND.  Gastrointestinal: Negative for abdominal pain, heartburn, melena, nausea and vomiting.  Musculoskeletal: Negative for falls and myalgias.  Skin: Negative for rash.  Neurological: Negative for dizziness, tingling, tremors, sensory change, speech change, focal weakness, loss of consciousness and weakness.  Endo/Heme/Allergies: Does not bruise/bleed easily.  Psychiatric/Behavioral: Negative for substance abuse. The patient is not nervous/anxious.   All other systems reviewed and are negative.    EKGs/Labs/Other Studies Reviewed:    Studies reviewed were summarized above. The additional studies were reviewed  today:  2D echo 11/14/2019: 1. Left ventricular ejection fraction, by estimation, is 55 to 60%. The  left ventricle has normal function. The left ventricle has no regional  wall motion abnormalities. Left ventricular diastolic parameters are  consistent with Grade II diastolic  dysfunction (pseudonormalization).  2. Right ventricular systolic function is normal. The right ventricular  size is normal.  3. The mitral valve is normal in structure. No evidence of mitral valve  regurgitation. No evidence of mitral stenosis.  4. The aortic valve is tricuspid. Aortic valve regurgitation is not  visualized. Mild aortic valve sclerosis is present, with no evidence of  aortic valve stenosis.  5. The inferior vena cava is normal in size with greater than 50%  respiratory variability, suggesting right atrial pressure of 3 mmHg. __________  2D echo 09/2014: - Left ventricle: The cavity size was normal. Wall thickness was  normal. The estimated ejection fraction was 65%. Wall motion was  normal; there were no regional wall motion abnormalities.  - Right ventricle: The cavity size  was normal. Systolic function  was normal. __________  Nuclear stress test 12/2008: No significant stress-induced defects, EF 69% with normal wall motion Negative scan    EKG:  EKG is ordered today.  The EKG ordered today demonstrates NSR, 60 bpm, left axis deviation, LVH, nonspecific IVCD, poor R wave progression along the precordial leads, no acute ST-T changes, no significant changes when compared to prior tracing.  Recent Labs: No results found for requested labs within last 8760 hours.  Recent Lipid Panel No results found for: CHOL, TRIG, HDL, CHOLHDL, VLDL, LDLCALC, LDLDIRECT  PHYSICAL EXAM:    VS:  BP (!) 190/90 (BP Location: Left Arm, Patient Position: Sitting, Cuff Size: Normal)   Pulse 60   Ht 5\' 10"  (1.778 m)   Wt 215 lb 8 oz (97.8 kg)   SpO2 98%   BMI 30.92 kg/m   BMI: Body mass index is  30.92 kg/m.  Physical Exam Vitals reviewed.  Constitutional:      Appearance: She is well-developed.  HENT:     Head: Normocephalic and atraumatic.  Eyes:     General:        Right eye: No discharge.        Left eye: No discharge.  Neck:     Vascular: No JVD.  Cardiovascular:     Rate and Rhythm: Normal rate and regular rhythm.     Pulses: No midsystolic click and no opening snap.          Dorsalis pedis pulses are 2+ on the right side and 2+ on the left side.       Posterior tibial pulses are 2+ on the right side and 2+ on the left side.     Heart sounds: S1 normal and S2 normal. Heart sounds not distant. Murmur heard.   Systolic murmur is present with a grade of 1/6 at the upper right sternal border. No friction rub.  Pulmonary:     Effort: Pulmonary effort is normal. No respiratory distress.     Breath sounds: Normal breath sounds. No decreased breath sounds, wheezing or rales.  Chest:     Chest wall: No tenderness.  Abdominal:     General: There is no distension.     Palpations: Abdomen is soft.     Tenderness: There is no abdominal tenderness.  Musculoskeletal:     Cervical back: Normal range of motion.  Skin:    General: Skin is warm and dry.     Nails: There is no clubbing.  Neurological:     Mental Status: She is alert and oriented to person, place, and time.  Psychiatric:        Speech: Speech normal.        Behavior: Behavior normal.        Thought Content: Thought content normal.        Judgment: Judgment normal.     Wt Readings from Last 3 Encounters:  07/14/20 215 lb 8 oz (97.8 kg)  11/28/19 225 lb 3.2 oz (102.2 kg)  09/03/19 228 lb (103.4 kg)     ASSESSMENT & PLAN:   1. Paroxysmal SVT: She is doing well without any symptoms for recurrence of tachypalpitations since her last visit.  She has not needed any as needed Lopressor.  Continue Toprol-XL 25 mg daily along with as needed Lopressor for tachypalpitations.  Historically, heart rates in sinus  have precluded escalation of beta-blocker therapy.  Should she have increased tachypalpitation burden moving forward we could consider referral to  EP for possible SVT ablation.  2. HTN: Blood pressure remains elevated in the office setting.  She reports a long history of whitecoat hypertension with home BP is typically running in the 140s mmHg systolic.it was noted today that she has a wrist sphygmomanometer.  I have advised her to wake up a brachial BP cuff.  Initially we were going to titrate her lisinopril or add low-dose amlodipine however she declines these changes today and would like to see how her BP trends with her new brachial sphygmomanometer.  Instructions were discussed in detail on how and when to take her blood pressure.  For now, she will continue lisinopril 30 mg daily and Toprol-XL 25 mg daily.  Low sodium diet is recommended.   3. Aortic valve sclerosis: With no evidence of aortic stenosis by echo in 11/2019.  Asymptomatic.  No further work-up indicated at this time.  Disposition: F/u with Dr. Okey DupreEnd or an APP in 2 months.   Medication Adjustments/Labs and Tests Ordered: Current medicines are reviewed at length with the patient today.  Concerns regarding medicines are outlined above. Medication changes, Labs and Tests ordered today are summarized above and listed in the Patient Instructions accessible in Encounters.   Signed, Eula Listenyan Mechille Varghese, PA-C 07/14/2020 9:00 AM     [facility] [address] [place], [place] [id number] [phone]

## 2020-07-14 ENCOUNTER — Ambulatory Visit (INDEPENDENT_AMBULATORY_CARE_PROVIDER_SITE_OTHER): Payer: Medicare Other | Admitting: Physician Assistant

## 2020-07-14 ENCOUNTER — Other Ambulatory Visit: Payer: Self-pay

## 2020-07-14 ENCOUNTER — Encounter: Payer: Self-pay | Admitting: Physician Assistant

## 2020-07-14 VITALS — BP 190/90 | HR 60 | Ht 70.0 in | Wt 215.5 lb

## 2020-07-14 DIAGNOSIS — I358 Other nonrheumatic aortic valve disorders: Secondary | ICD-10-CM

## 2020-07-14 DIAGNOSIS — I10 Essential (primary) hypertension: Secondary | ICD-10-CM

## 2020-07-14 DIAGNOSIS — I471 Supraventricular tachycardia: Secondary | ICD-10-CM | POA: Diagnosis not present

## 2020-07-14 NOTE — Patient Instructions (Addendum)
Medication Instructions:  Your physician recommends that you continue on your current medications as directed. Please refer to the Current Medication list given to you today.  *If you need a refill on your cardiac medications before your next appointment, please call your pharmacy*   Lab Work: None ordered If you have labs (blood work) drawn today and your tests are completely normal, you will receive your results only by: Marland Kitchen MyChart Message (if you have MyChart) OR . A paper copy in the mail If you have any lab test that is abnormal or we need to change your treatment, we will call you to review the results.   Testing/Procedures: None ordered   Follow-Up: At [facility], you and your health needs are our priority.  As part of our continuing mission to provide you with exceptional heart care, we have created designated Provider Care Teams.  These Care Teams include your primary Cardiologist (physician) and Advanced Practice Providers (APPs -  Physician Assistants and Nurse Practitioners) who all work together to provide you with the care you need, when you need it.  We recommend signing up for the patient portal called "MyChart".  Sign up information is provided on this After Visit Summary.  MyChart is used to connect with patients for Virtual Visits (Telemedicine).  Patients are able to view lab/test results, encounter notes, upcoming appointments, etc.  Non-urgent messages can be sent to your provider as well.   To learn more about what you can do with MyChart, go to [email or web address].    Your next appointment:   2 month(s)  The format for your next appointment:   In Person  Provider:   Eula Listen, PA-C   Other Instructions Please purchase a over the arm Om[employer] blood pressure machine.  Your physician has requested that you regularly monitor and record your blood pressure readings at home. Please use the same machine at the same time of day to check your readings and  record them. Bring your readings and blood pressure machine to your follow-up visit.

## 2020-09-09 NOTE — Progress Notes (Deleted)
Cardiology Office Note    Date:  09/09/2020   ID:  Jordan Barron, DOB January 10, 1951, MRN [id number]  PCP:  [facility]  Cardiologist:  Yvonne Kendall, MD  Electrophysiologist:  None   Chief Complaint: Follow up  History of Present Illness:   Jordan Barron is a 70 y.o. female with history of paroxysmal SVT and HTN who presents for follow-up of HTN.  She was previously evaluated by Dr. Patty Sermons for SVT in 09/2014 with recommendation to initiate metoprolol succinate 12.5 mg daily and proceed with Holter monitoring and echo. Echo performed at that time was normal as outlined below. It does not appear Holter monitor was completed.She was seen in the [facility], ED in 05/2017 with palpitations and SOB. EKG showed SVT, which was successfully terminated with 6 mg of IV adenosine x1 with resolution in symptoms. This was her third episode of SVT, andfirst since her visit in 2016. She was seen in the office in 06/2017 and was doing well from a cardiac perspective with some mild swelling noted in her feet. She did note she had been fairly sedentary. She was continued on Toprol-XL 25 mg daily with resting heart rate in the 60s precluding further titration. She was also provided a prescription for Lopressor 25 mg twice daily as needed for palpitations.She was seen in the office in 08/2019 for routine follow up and was doing well, having noted only 1 episode of tachypalpitations since she was seen prior to that visit. Her BP was elevated, though reported a long history of white coat hypertension. She underwent echo in 11/2019 for evaluation of a murmur noted on exam that showed an EF of 55-60%, no RWMA, Gr2DD, RVSF and ventricular cavity size normal, aortic valve sclerosis without stenosis was noted.  She was seen in the office in 11/2019 and was doing well from a cardiac perspective.  No changes were indicated.  BP remained elevated, suspected to be in the setting of whitecoat  hypertension.  I last saw her on 07/14/2020 at which time she was doing well from a cardiac perspective.  She has not had any tachypalpitations.  Her weight was down 10 pounds when compared to her visit in 11/2019.  Home BP readings are predominantly in the 140s with occasional readings in the 130s to 150s systolic.  It was noted her home BP cuff was a wrist sphygmomanometer.  BP was elevated in the office at 190/98 with noted patient reported history of whitecoat hypertension.  She was advised to pick up a brachial BP cuff with plans to follow-up today for reevaluation of her hypertension.  ***   Labs independently reviewed: 05/2017-magnesium 2.0, potassium 3.9, BUN 15, serum creatinine 0.88, Hgb 14.3, PLT 355   Past Medical History:  Diagnosis Date  . Hypertension   . Paroxysmal SVT (supraventricular tachycardia) (HCC)     Past Surgical History:  Procedure Laterality Date  . ABDOMINAL HYSTERECTOMY      Current Medications: No outpatient medications have been marked as taking for the 09/14/20 encounter (Appointment) with Sondra Barges, PA-C.    Allergies:   Patient has no known allergies.   Social History   Socioeconomic History  . Marital status: Single    Spouse name: Not on file  . Number of children: Not on file  . Years of education: Not on file  . Highest education level: Not on file  Occupational History  . Not on file  Tobacco Use  . Smoking status:  Never Smoker  . Smokeless tobacco: Never Used  Substance and Sexual Activity  . Alcohol use: No  . Drug use: No  . Sexual activity: Not on file  Other Topics Concern  . Not on file  Social History Narrative  . Not on file   Social Determinants of Health   Financial Resource Strain: Not on file  Food Insecurity: Not on file  Transportation Needs: Not on file  Physical Activity: Not on file  Stress: Not on file  Social Connections: Not on file     Family History:  The patient's family history includes Other  in her mother.  ROS:   ROS   EKGs/Labs/Other Studies Reviewed:    Studies reviewed were summarized above. The additional studies were reviewed today:  2D echo 11/14/2019: 1. Left ventricular ejection fraction, by estimation, is 55 to 60%. The  left ventricle has normal function. The left ventricle has no regional  wall motion abnormalities. Left ventricular diastolic parameters are  consistent with Grade II diastolic  dysfunction (pseudonormalization).  2. Right ventricular systolic function is normal. The right ventricular  size is normal.  3. The mitral valve is normal in structure. No evidence of mitral valve  regurgitation. No evidence of mitral stenosis.  4. The aortic valve is tricuspid. Aortic valve regurgitation is not  visualized. Mild aortic valve sclerosis is present, with no evidence of  aortic valve stenosis.  5. The inferior vena cava is normal in size with greater than 50%  respiratory variability, suggesting right atrial pressure of 3 mmHg. __________  2D echo 09/2014: - Left ventricle: The cavity size was normal. Wall thickness was  normal. The estimated ejection fraction was 65%. Wall motion was  normal; there were no regional wall motion abnormalities.  - Right ventricle: The cavity size was normal. Systolic function  was normal. __________  Nuclear stress test 12/2008: No significant stress-induced defects, EF 69% with normal wall motion Negative scan   EKG:  EKG is ordered today.  The EKG ordered today demonstrates ***  Recent Labs: No results found for requested labs within last 8760 hours.  Recent Lipid Panel No results found for: CHOL, TRIG, HDL, CHOLHDL, VLDL, LDLCALC, LDLDIRECT  PHYSICAL EXAM:    VS:  There were no vitals taken for this visit.  BMI: There is no height or weight on file to calculate BMI.  Physical Exam  Wt Readings from Last 3 Encounters:  07/14/20 215 lb 8 oz (97.8 kg)  11/28/19 225 lb 3.2 oz (102.2 kg)   09/03/19 228 lb (103.4 kg)     ASSESSMENT & PLAN:   1. Paroxysmal SVT:  2. HTN: Blood pressure ***  3. Aortic valve sclerosis:  Disposition: F/u with Dr. Okey Dupre or an APP in ***.   Medication Adjustments/Labs and Tests Ordered: Current medicines are reviewed at length with the patient today.  Concerns regarding medicines are outlined above. Medication changes, Labs and Tests ordered today are summarized above and listed in the Patient Instructions accessible in Encounters.   Signed, Eula Listen, PA-C 09/09/2020 12:14 PM     [facility] [place] [address] [place], [place] [address] [phone]

## 2020-09-14 ENCOUNTER — Ambulatory Visit: Payer: Medicare Other | Admitting: Physician Assistant

## 2020-09-18 ENCOUNTER — Other Ambulatory Visit: Payer: Self-pay

## 2020-09-18 ENCOUNTER — Observation Stay
Admission: EM | Admit: 2020-09-18 | Discharge: 2020-09-20 | Disposition: A | Discharge status: Home / Self Care | Payer: Medicare Other | Attending: Internal Medicine | Admitting: Internal Medicine

## 2020-09-18 DIAGNOSIS — I6782 Cerebral ischemia: Secondary | ICD-10-CM | POA: Insufficient documentation

## 2020-09-18 DIAGNOSIS — Z20822 Contact with and (suspected) exposure to covid-19: Secondary | ICD-10-CM | POA: Diagnosis not present

## 2020-09-18 DIAGNOSIS — I471 Supraventricular tachycardia, unspecified: Secondary | ICD-10-CM | POA: Diagnosis present

## 2020-09-18 DIAGNOSIS — H8110 Benign paroxysmal vertigo, unspecified ear: Secondary | ICD-10-CM

## 2020-09-18 DIAGNOSIS — R7301 Impaired fasting glucose: Secondary | ICD-10-CM

## 2020-09-18 DIAGNOSIS — Z7982 Long term (current) use of aspirin: Secondary | ICD-10-CM | POA: Insufficient documentation

## 2020-09-18 DIAGNOSIS — H81399 Other peripheral vertigo, unspecified ear: Principal | ICD-10-CM | POA: Insufficient documentation

## 2020-09-18 DIAGNOSIS — I10 Essential (primary) hypertension: Secondary | ICD-10-CM | POA: Diagnosis not present

## 2020-09-18 DIAGNOSIS — R42 Dizziness and giddiness: Secondary | ICD-10-CM | POA: Diagnosis present

## 2020-09-18 DIAGNOSIS — G935 Compression of brain: Secondary | ICD-10-CM

## 2020-09-18 DIAGNOSIS — Z79899 Other long term (current) drug therapy: Secondary | ICD-10-CM | POA: Diagnosis not present

## 2020-09-18 LAB — CBC
HCT: 41.2 % (ref 36.0–46.0)
Hemoglobin: 13.2 g/dL (ref 12.0–15.0)
MCH: 22.4 pg — ABNORMAL LOW (ref 26.0–34.0)
MCHC: 32 g/dL (ref 30.0–36.0)
MCV: 69.9 fL — ABNORMAL LOW (ref 80.0–100.0)
Platelets: 330 10*3/uL (ref 150–400)
RBC: 5.89 MIL/uL — ABNORMAL HIGH (ref 3.87–5.11)
RDW: 16 % — ABNORMAL HIGH (ref 11.5–15.5)
WBC: 8.9 10*3/uL (ref 4.0–10.5)
nRBC: 0 % (ref 0.0–0.2)

## 2020-09-18 LAB — BASIC METABOLIC PANEL
Anion gap: 5 (ref 5–15)
BUN: 16 mg/dL (ref 8–23)
CO2: 25 mmol/L (ref 22–32)
Calcium: 9.6 mg/dL (ref 8.9–10.3)
Chloride: 106 mmol/L (ref 98–111)
Creatinine, Ser: 0.58 mg/dL (ref 0.44–1.00)
GFR, Estimated: >60 mL/min (ref >60)
Glucose, Bld: 129 mg/dL — ABNORMAL HIGH (ref 70–99)
Potassium: 4.2 mmol/L (ref 3.5–5.1)
Sodium: 136 mmol/L (ref 135–145)

## 2020-09-18 LAB — TROPONIN I (HIGH SENSITIVITY): Troponin I (High Sensitivity): 4 ng/L (ref <18)

## 2020-09-18 MED ORDER — MECLIZINE HCL 25 MG PO TABS
25.0000 mg | ORAL_TABLET | Freq: Once | ORAL | Status: AC
  Administered 2020-09-19: 25 mg via ORAL
  Filled 2020-09-18: qty 1

## 2020-09-18 NOTE — ED Triage Notes (Signed)
Pt presents to ER c/o palpitations and dizziness since 1800 tonight. Pt has had palpitations in past and has been prescribed metoprolol when her heart rate is >110.  Pt A&O x4 at this time and is in no acute distress but states she does feel very dizzy at this moment.

## 2020-09-19 ENCOUNTER — Emergency Department: Payer: Medicare Other

## 2020-09-19 DIAGNOSIS — H81399 Other peripheral vertigo, unspecified ear: Secondary | ICD-10-CM | POA: Diagnosis not present

## 2020-09-19 DIAGNOSIS — I10 Essential (primary) hypertension: Secondary | ICD-10-CM | POA: Diagnosis not present

## 2020-09-19 DIAGNOSIS — I471 Supraventricular tachycardia: Secondary | ICD-10-CM | POA: Diagnosis not present

## 2020-09-19 DIAGNOSIS — R42 Dizziness and giddiness: Secondary | ICD-10-CM | POA: Diagnosis not present

## 2020-09-19 LAB — URINE DRUG SCREEN, QUALITATIVE (ARMC ONLY)
Amphetamines, Ur Screen: NOT DETECTED
Barbiturates, Ur Screen: NOT DETECTED
Benzodiazepine, Ur Scrn: NOT DETECTED
Cannabinoid 50 Ng, Ur ~~LOC~~: NOT DETECTED
Cocaine Metabolite,Ur ~~LOC~~: NOT DETECTED
MDMA (Ecstasy)Ur Screen: NOT DETECTED
Methadone Scn, Ur: NOT DETECTED
Opiate, Ur Screen: NOT DETECTED
Phencyclidine (PCP) Ur S: NOT DETECTED
Tricyclic, Ur Screen: NOT DETECTED

## 2020-09-19 LAB — URINALYSIS, ROUTINE W REFLEX MICROSCOPIC
Bilirubin Urine: NEGATIVE
Glucose, UA: NEGATIVE mg/dL
Hgb urine dipstick: NEGATIVE
Ketones, ur: NEGATIVE mg/dL
Leukocytes,Ua: NEGATIVE
Nitrite: NEGATIVE
Protein, ur: NEGATIVE mg/dL
Specific Gravity, Urine: 1.012 (ref 1.005–1.030)
pH: 7 (ref 5.0–8.0)

## 2020-09-19 LAB — RESP PANEL BY RT-PCR (FLU A&B, COVID) ARPGX2
Influenza A by PCR: NEGATIVE
Influenza B by PCR: NEGATIVE
SARS Coronavirus 2 by RT PCR: NEGATIVE

## 2020-09-19 LAB — MAGNESIUM: Magnesium: 2.1 mg/dL (ref 1.7–2.4)

## 2020-09-19 LAB — TROPONIN I (HIGH SENSITIVITY): Troponin I (High Sensitivity): 5 ng/L (ref <18)

## 2020-09-19 IMAGING — MR MR HEAD W/O CM
6 of 9 series · 22 of 48 positions shown · non-contrast
Comparison: No pertinent prior exam.
COMPARISON: Prior head CT from [DATE].

CLINICAL DATA: Initial evaluation for acute ataxia, stroke
suspected.

EXAM:
MRI HEAD WITHOUT CONTRAST
MRA HEAD WITHOUT CONTRAST
TECHNIQUE: Multiplanar, multi-echo pulse sequences of the brain and surrounding
structures were acquired without intravenous contrast. Angiographic
images of the Circle of Willis were acquired using MRA technique
without intravenous contrast.

[Series 7: cor dwi_tracew · coronal · 5.0mm · 0.60mm/px · 3 of 36 slices shown]
[im 1/36]
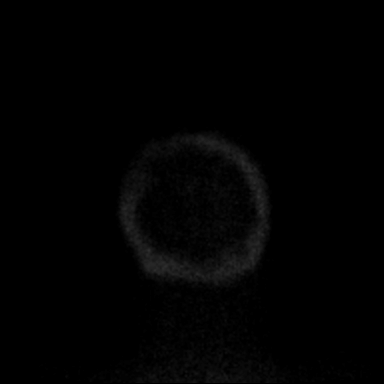
[im 18/36]
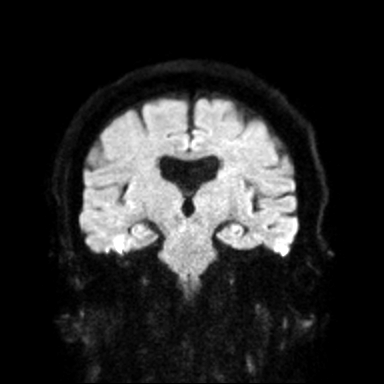
[im 36/36]
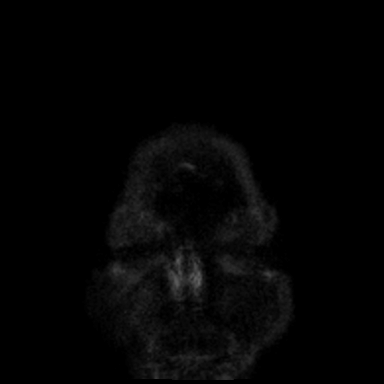

[Series 9: TOF · axial · 0.5mm · 0.41mm/px · z∈[-63,+35]mm · 8 of 224 slices shown]
[im 13/224]
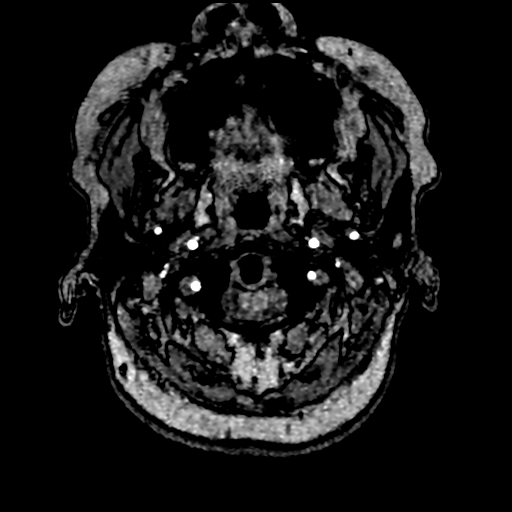
[im 38/224]
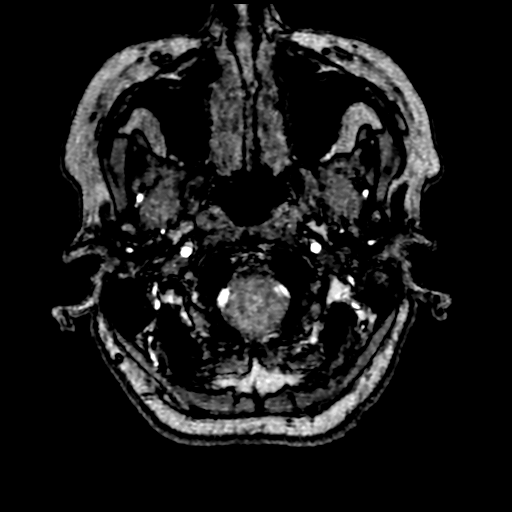
[im 62/224]
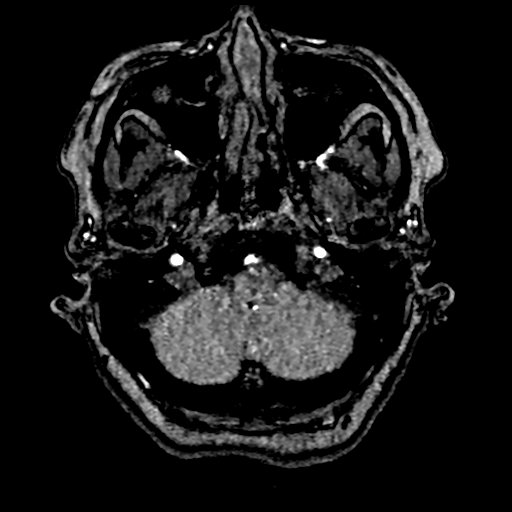
[im 100/224]
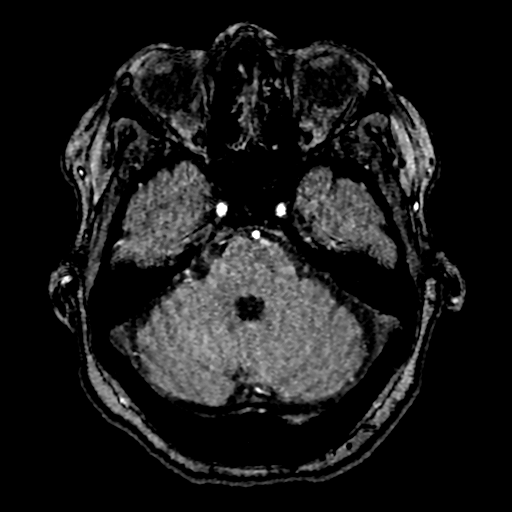
[im 124/224]
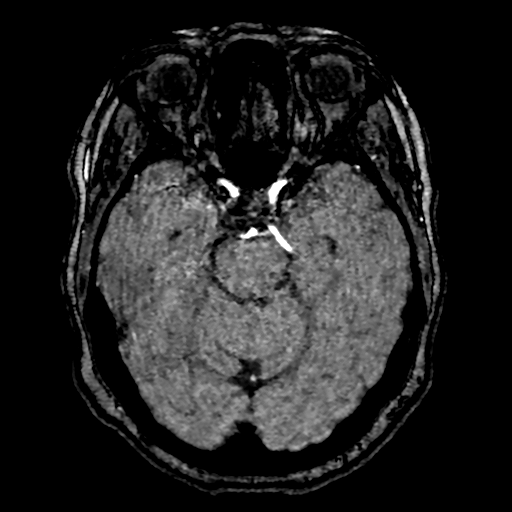
[im 162/224]
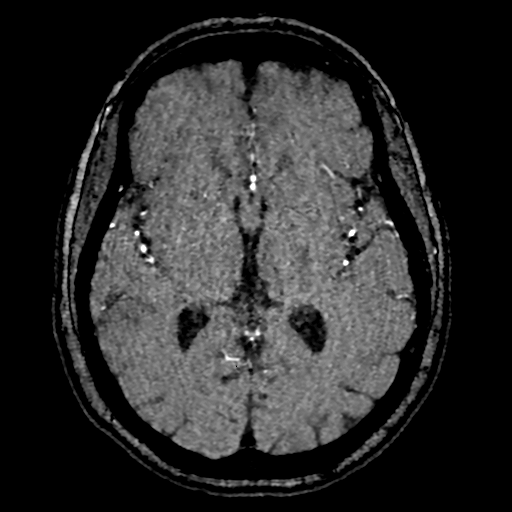
[im 186/224]
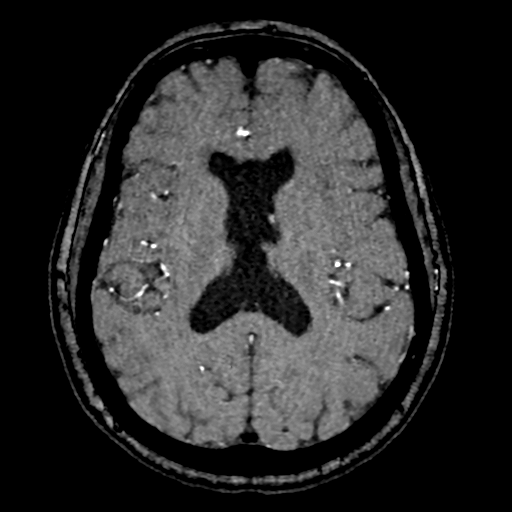
[im 211/224]
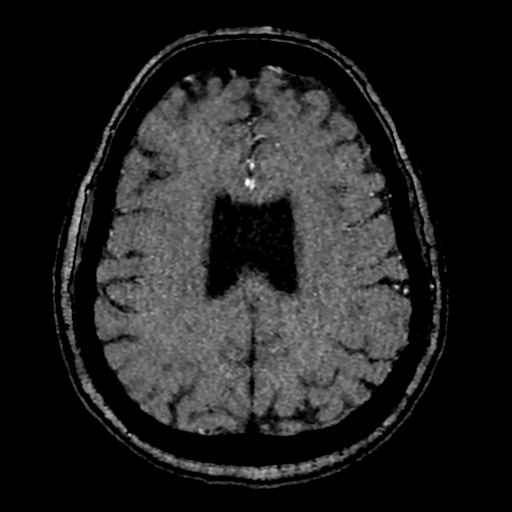

[Series 14: FLAIR · axial · 3.0mm · 0.53mm/px · z∈[-64,+86]mm · 4 of 51 slices shown]
[im 1/51]
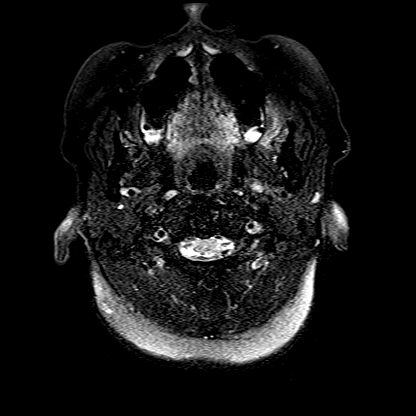
[im 17/51]
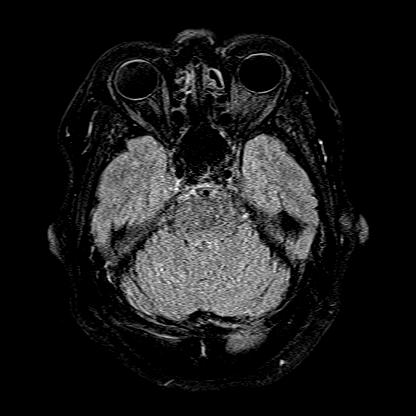
[im 34/51]
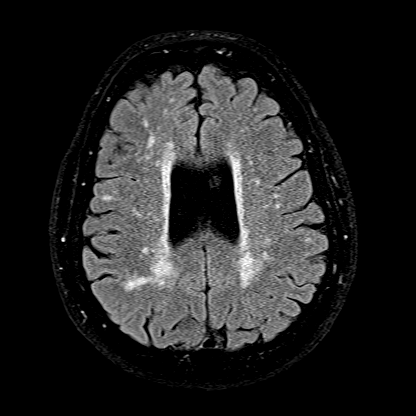
[im 51/51]
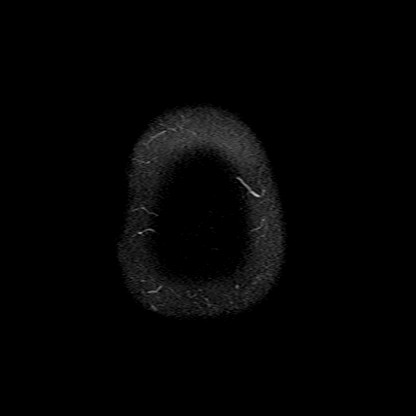

[Series 19: T1 · sagittal · 5.0mm · 0.62mm/px · 2 of 22 slices shown]
[im 1/22]
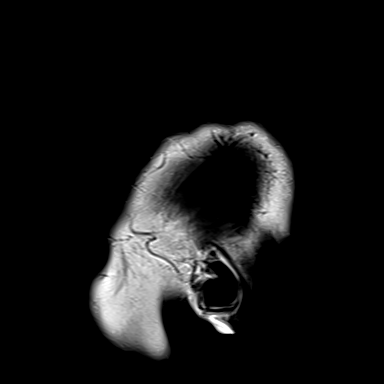
[im 22/22]
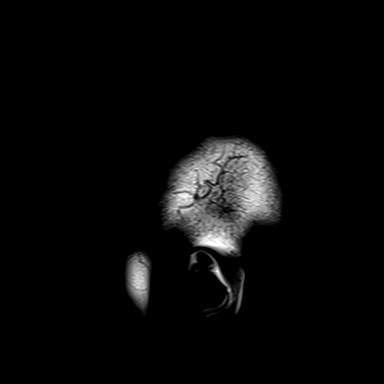

[Series 21: T2 · axial · 5.0mm · 0.45mm/px · z∈[-66,+89]mm · 2 of 27 slices shown (1 of 2)]
[im 1/27]
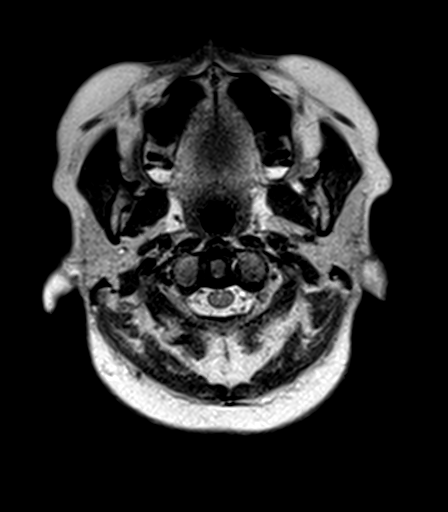
[im 27/27]
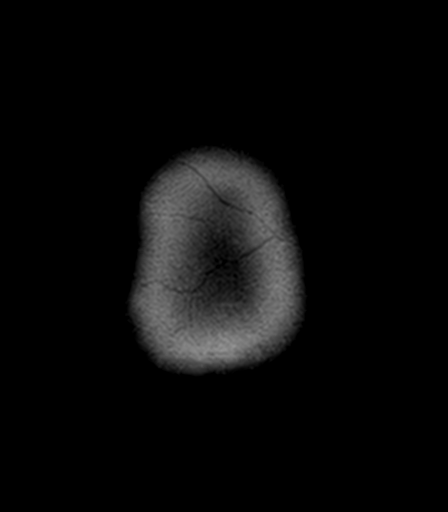

[Series 22: T2 · coronal · 5.0mm · 0.45mm/px · 3 of 31 slices shown (2 of 2)]
[im 1/31]
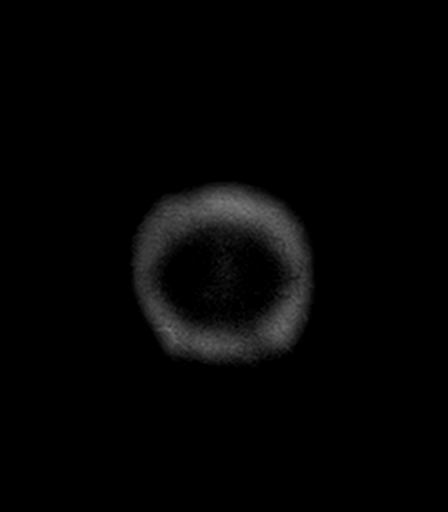
[im 16/31]
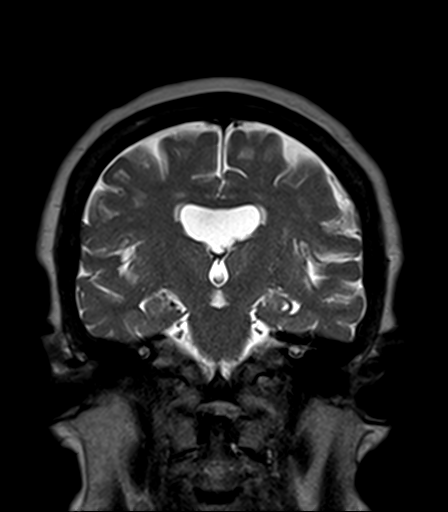
[im 31/31]
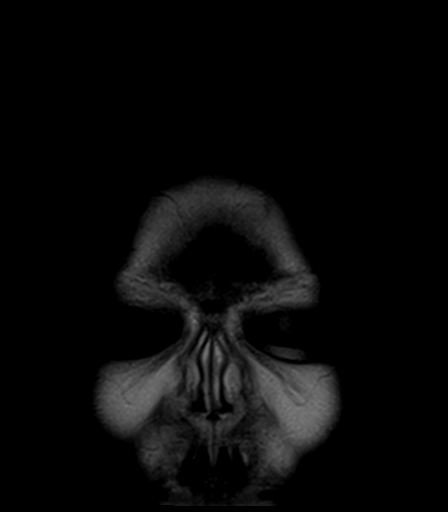

[22 of 48 positions shown; findings below may reference images not displayed]

FINDINGS: MRI HEAD FINDINGS

Brain: Cerebral volume within normal limits for age. Scattered
patchy T2/FLAIR hyperintensity within the periventricular deep white
matter both cerebral hemispheres, most likely related chronic
microvascular ischemic disease, moderate in nature.

No abnormal foci of restricted diffusion to suggest acute or
subacute ischemia. Gray-white matter differentiation maintained. No
encephalomalacia to suggest chronic cortical infarction. No foci of
susceptibility artifact to suggest acute or chronic intracranial
hemorrhage.

No mass lesion, midline shift or mass effect. No hydrocephalus.
Absence of the septum pellucidum noted. No extra-axial fluid
collection. Pituitary gland suprasellar region normal. Midline
structures intact.

Vascular: Major intracranial vascular flow voids are within normal
limits.

Skull and upper cervical spine: Cerebellar tonsils are beaked and
extend up to 1 cm below the foramen magnum, consistent with Chiari 1
malformation. No visible syrinx within the upper cervical spinal
cord. Bone marrow signal intensity within normal limits. No scalp
soft tissue abnormality.

Sinuses/Orbits: Globes and orbital soft tissues within normal
limits. Paranasal sinuses are largely clear. No mastoid effusion.
Inner ear structures grossly normal.

Other: None.

MRA HEAD FINDINGS

Anterior circulation: Examination mildly degraded by motion
artifact.

Visualized distal cervical segments of the internal carotid arteries
are patent with symmetric antegrade flow. Petrous, cavernous, and
supraclinoid segments widely patent without stenosis or other
abnormality. A1 segments patent bilaterally. Normal anterior
communicating artery complex. Anterior cerebral arteries patent to
their distal aspects without stenosis. No M1 stenosis or occlusion.
Normal MCA bifurcations. Distal MCA branches well perfused and
symmetric.

Posterior circulation: Both vertebral arteries patent to the
vertebrobasilar junction without stenosis. Right vertebral artery
dominant. Neither PICA origin well visualized. Basilar patent to its
distal aspect without stenosis. Superior cerebellar arteries patent
bilaterally. Left PCA supplied via the basilar. Right PCA supplied
via the basilar as well as a prominent right posterior communicating
artery. Both PCAs well perfused to their distal aspects without
stenosis.

No intracranial aneurysm.

Anatomic variants: Dominant right vertebral artery.
IMPRESSION: MRI HEAD IMPRESSION:

1. No acute intracranial abnormality.
2. Chiari 1 malformation with the cerebellar tonsils extending up to
1 cm below the foramen magnum.
3. Underlying moderate chronic microvascular ischemic disease.

MRA HEAD IMPRESSION:

Normal intracranial MRA. No large vessel occlusion, hemodynamically
significant stenosis, or other acute vascular abnormality.

## 2020-09-19 IMAGING — MR MR MRA HEAD W/O CM
1 series · 18 of 48 positions shown · non-contrast
Comparison: No pertinent prior exam.
COMPARISON: Prior head CT from [DATE].

CLINICAL DATA: Initial evaluation for acute ataxia, stroke
suspected.

EXAM:
MRI HEAD WITHOUT CONTRAST
MRA HEAD WITHOUT CONTRAST
TECHNIQUE: Multiplanar, multi-echo pulse sequences of the brain and surrounding
structures were acquired without intravenous contrast. Angiographic
images of the Circle of Willis were acquired using MRA technique
without intravenous contrast.

[Series 9: TOF · axial · 0.5mm · 0.41mm/px · z∈[-69,+37]mm · 18 of 224 slices shown]
[im 1/224]
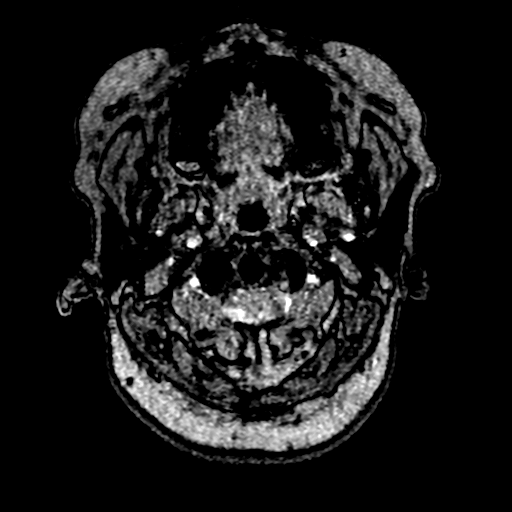
[im 5/224]
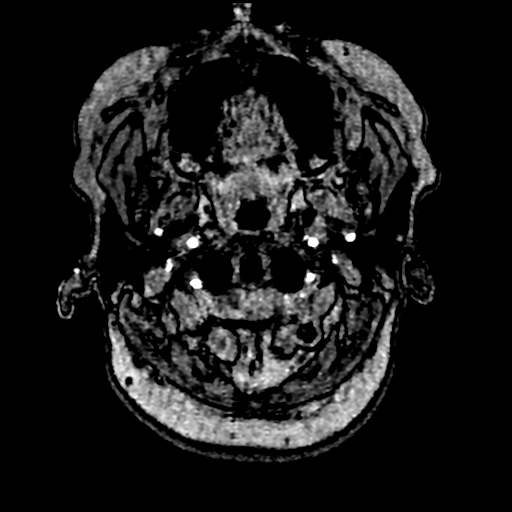
[im 10/224]
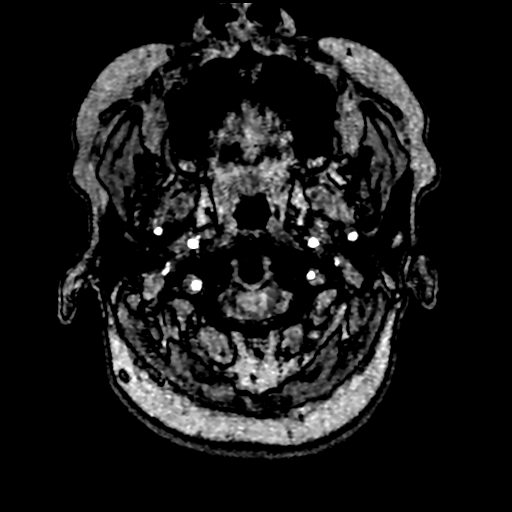
[im 15/224]
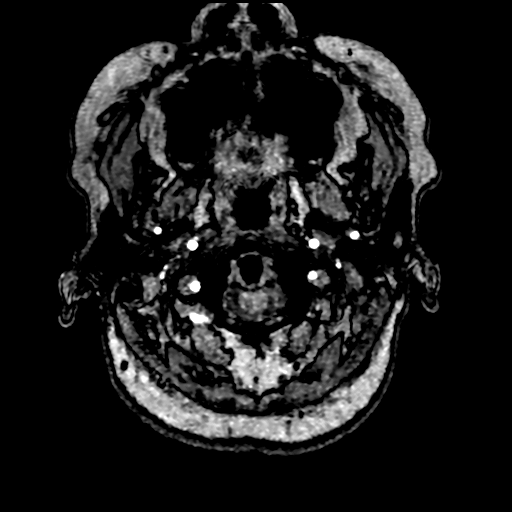
[im 19/224]
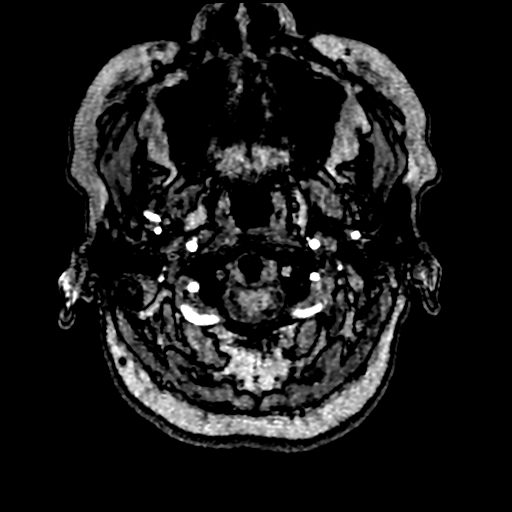
[im 24/224]
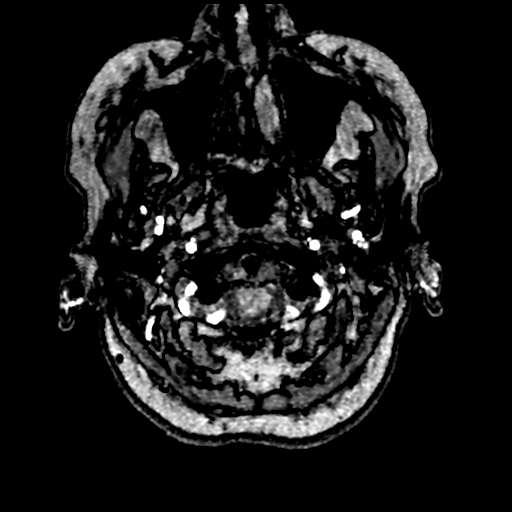
[im 29/224]
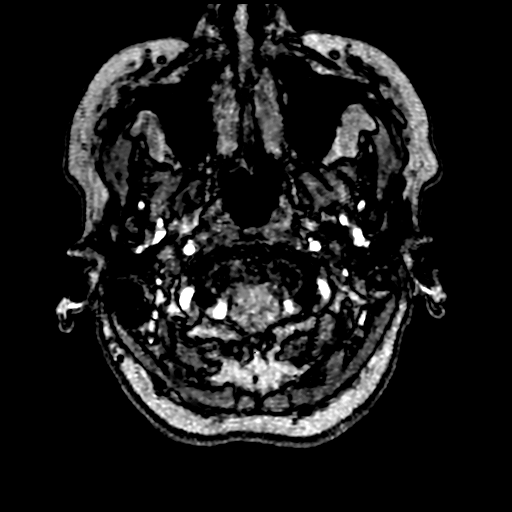
[im 34/224]
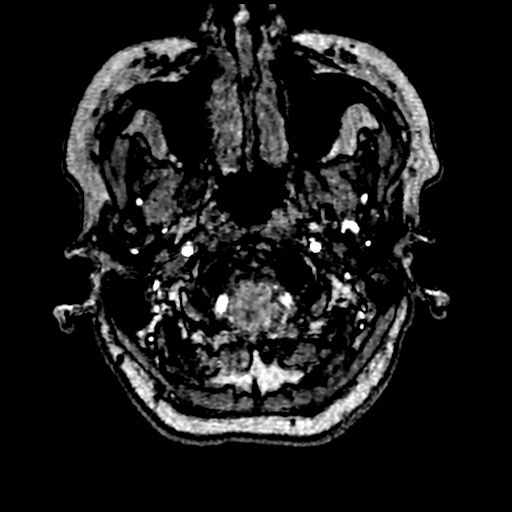
[im 38/224]
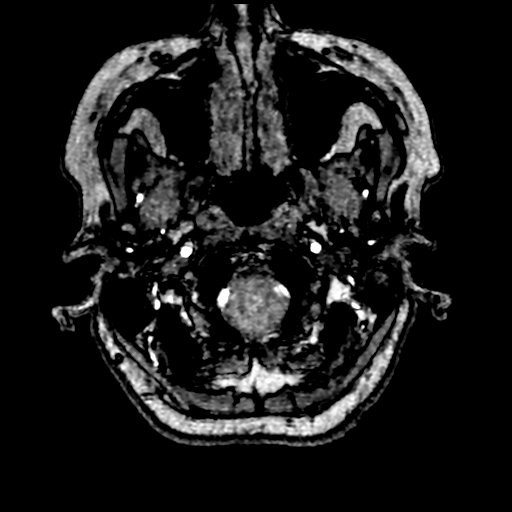
[im 43/224]
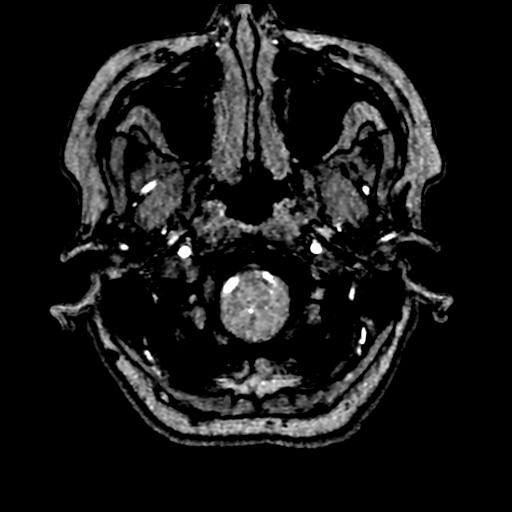
[im 72/224]
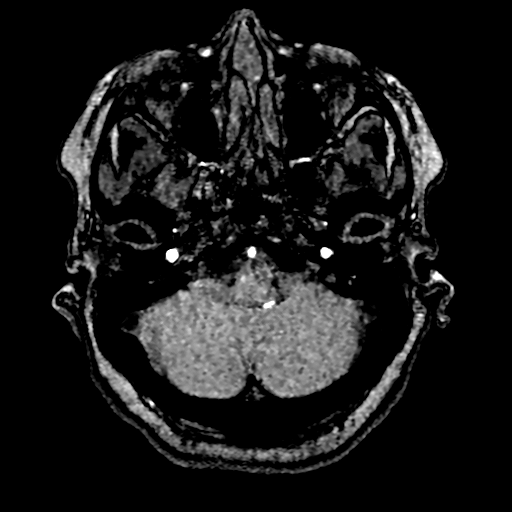
[im 100/224]
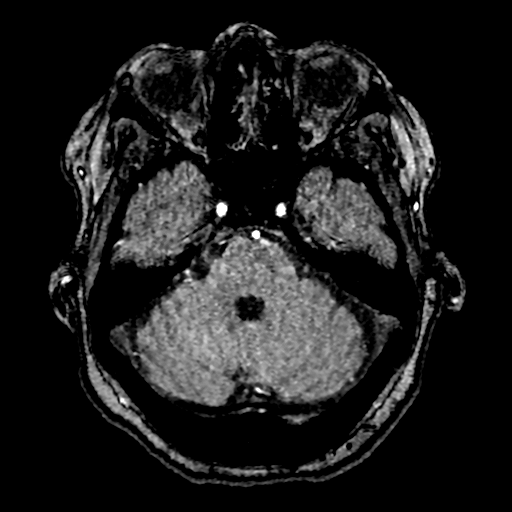
[im 114/224]
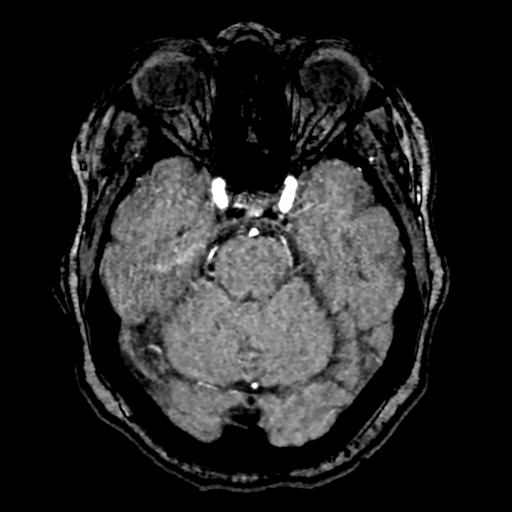
[im 129/224]
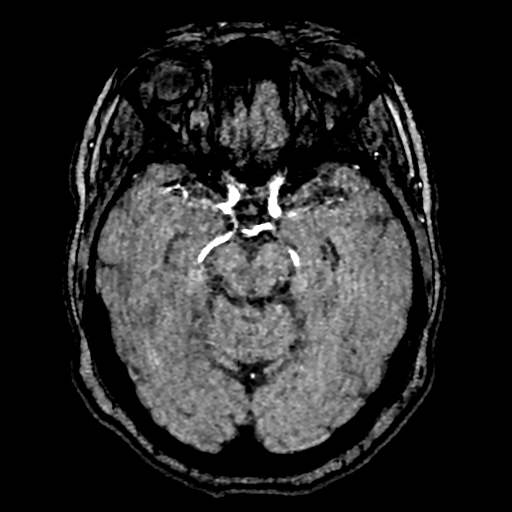
[im 157/224]
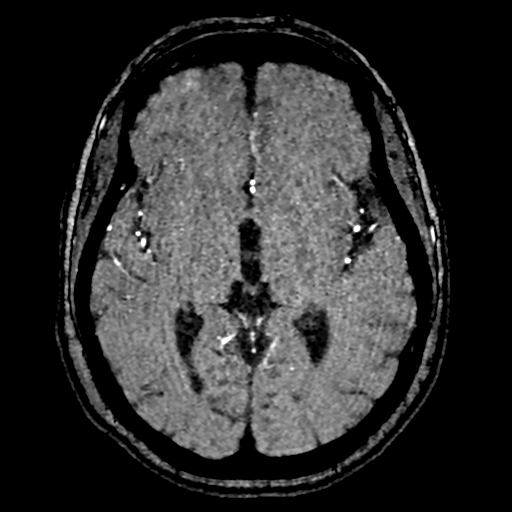
[im 186/224]
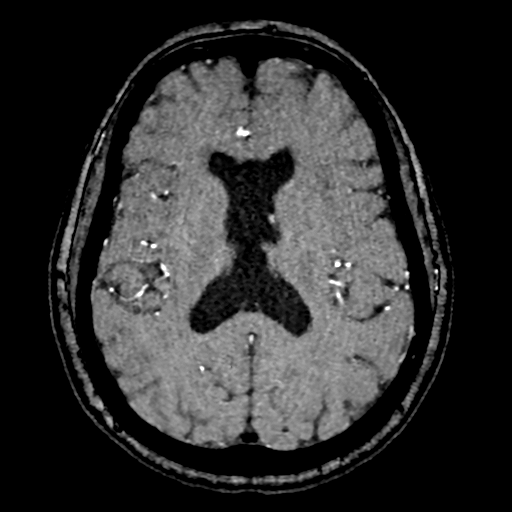
[im 190/224]
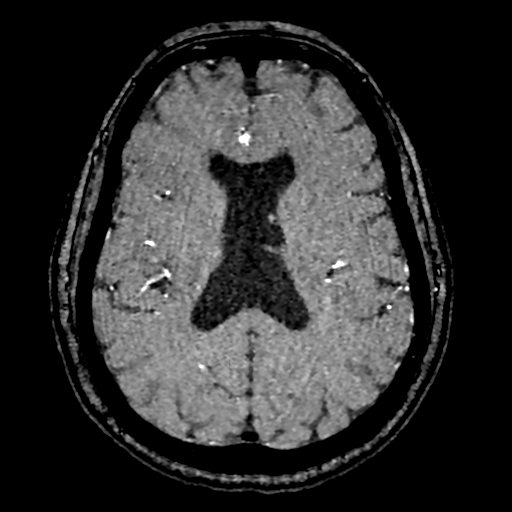
[im 214/224]
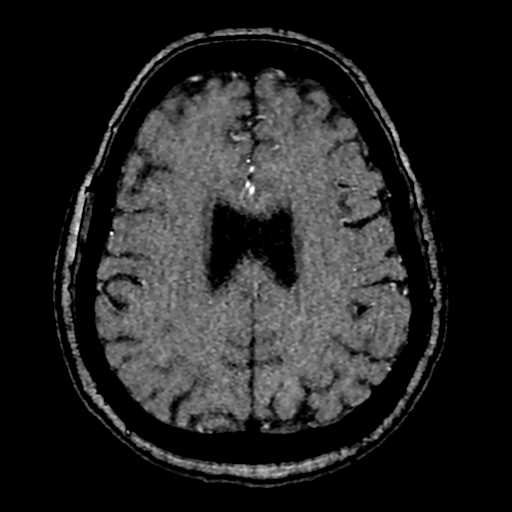

[18 of 48 positions shown; findings below may reference images not displayed]

FINDINGS: MRI HEAD FINDINGS

Brain: Cerebral volume within normal limits for age. Scattered
patchy T2/FLAIR hyperintensity within the periventricular deep white
matter both cerebral hemispheres, most likely related chronic
microvascular ischemic disease, moderate in nature.

No abnormal foci of restricted diffusion to suggest acute or
subacute ischemia. Gray-white matter differentiation maintained. No
encephalomalacia to suggest chronic cortical infarction. No foci of
susceptibility artifact to suggest acute or chronic intracranial
hemorrhage.

No mass lesion, midline shift or mass effect. No hydrocephalus.
Absence of the septum pellucidum noted. No extra-axial fluid
collection. Pituitary gland suprasellar region normal. Midline
structures intact.

Vascular: Major intracranial vascular flow voids are within normal
limits.

Skull and upper cervical spine: Cerebellar tonsils are beaked and
extend up to 1 cm below the foramen magnum, consistent with Chiari 1
malformation. No visible syrinx within the upper cervical spinal
cord. Bone marrow signal intensity within normal limits. No scalp
soft tissue abnormality.

Sinuses/Orbits: Globes and orbital soft tissues within normal
limits. Paranasal sinuses are largely clear. No mastoid effusion.
Inner ear structures grossly normal.

Other: None.

MRA HEAD FINDINGS

Anterior circulation: Examination mildly degraded by motion
artifact.

Visualized distal cervical segments of the internal carotid arteries
are patent with symmetric antegrade flow. Petrous, cavernous, and
supraclinoid segments widely patent without stenosis or other
abnormality. A1 segments patent bilaterally. Normal anterior
communicating artery complex. Anterior cerebral arteries patent to
their distal aspects without stenosis. No M1 stenosis or occlusion.
Normal MCA bifurcations. Distal MCA branches well perfused and
symmetric.

Posterior circulation: Both vertebral arteries patent to the
vertebrobasilar junction without stenosis. Right vertebral artery
dominant. Neither PICA origin well visualized. Basilar patent to its
distal aspect without stenosis. Superior cerebellar arteries patent
bilaterally. Left PCA supplied via the basilar. Right PCA supplied
via the basilar as well as a prominent right posterior communicating
artery. Both PCAs well perfused to their distal aspects without
stenosis.

No intracranial aneurysm.

Anatomic variants: Dominant right vertebral artery.
IMPRESSION: MRI HEAD IMPRESSION:

1. No acute intracranial abnormality.
2. Chiari 1 malformation with the cerebellar tonsils extending up to
1 cm below the foramen magnum.
3. Underlying moderate chronic microvascular ischemic disease.

MRA HEAD IMPRESSION:

Normal intracranial MRA. No large vessel occlusion, hemodynamically
significant stenosis, or other acute vascular abnormality.

## 2020-09-19 MED ORDER — ADULT MULTIVITAMIN W/MINERALS CH
1.0000 | ORAL_TABLET | Freq: Every day | ORAL | Status: DC
  Administered 2020-09-19 – 2020-09-20 (×2): 1 via ORAL
  Filled 2020-09-19 (×2): qty 1

## 2020-09-19 MED ORDER — MULTIVITAMINS PO CAPS: 1.0000 | ORAL_CAPSULE | Freq: Every day | ORAL | Status: DC

## 2020-09-19 MED ORDER — SODIUM CHLORIDE 0.9% FLUSH: 3.0000 mL | INTRAVENOUS | Status: DC | PRN

## 2020-09-19 MED ORDER — ACETAMINOPHEN 325 MG PO TABS
650.0000 mg | ORAL_TABLET | Freq: Four times a day (QID) | ORAL | Status: DC | PRN
  Administered 2020-09-19: 650 mg via ORAL
  Filled 2020-09-19: qty 2

## 2020-09-19 MED ORDER — DIAZEPAM 2 MG PO TABS
2.0000 mg | ORAL_TABLET | Freq: Once | ORAL | Status: AC
  Administered 2020-09-19: 2 mg via ORAL
  Filled 2020-09-19: qty 1

## 2020-09-19 MED ORDER — LISINOPRIL 20 MG PO TABS
30.0000 mg | ORAL_TABLET | Freq: Every day | ORAL | Status: DC
  Administered 2020-09-19 – 2020-09-20 (×2): 30 mg via ORAL
  Filled 2020-09-19 (×2): qty 1

## 2020-09-19 MED ORDER — SODIUM CHLORIDE 0.9% FLUSH
3.0000 mL | Freq: Two times a day (BID) | INTRAVENOUS | Status: DC
  Administered 2020-09-19 – 2020-09-20 (×3): 3 mL via INTRAVENOUS

## 2020-09-19 MED ORDER — LORATADINE 10 MG PO TABS: 10.0000 mg | ORAL_TABLET | Freq: Every day | ORAL | Status: DC | PRN

## 2020-09-19 MED ORDER — ENOXAPARIN SODIUM 40 MG/0.4ML IJ SOSY
40.0000 mg | PREFILLED_SYRINGE | INTRAMUSCULAR | Status: DC
  Filled 2020-09-19: qty 0.4

## 2020-09-19 MED ORDER — PANTOPRAZOLE SODIUM 20 MG PO TBEC
20.0000 mg | DELAYED_RELEASE_TABLET | Freq: Every day | ORAL | Status: DC | PRN
  Filled 2020-09-19: qty 1

## 2020-09-19 MED ORDER — ONDANSETRON HCL 4 MG PO TABS: 4.0000 mg | ORAL_TABLET | Freq: Four times a day (QID) | ORAL | Status: DC | PRN

## 2020-09-19 MED ORDER — ACETAMINOPHEN 650 MG RE SUPP: 650.0000 mg | Freq: Four times a day (QID) | RECTAL | Status: DC | PRN

## 2020-09-19 MED ORDER — ENOXAPARIN SODIUM 40 MG/0.4ML IJ SOSY: 40.0000 mg | PREFILLED_SYRINGE | INTRAMUSCULAR | Status: DC

## 2020-09-19 MED ORDER — ASPIRIN EC 81 MG PO TBEC
81.0000 mg | DELAYED_RELEASE_TABLET | Freq: Every day | ORAL | Status: DC
  Administered 2020-09-19 – 2020-09-20 (×2): 81 mg via ORAL
  Filled 2020-09-19 (×2): qty 1

## 2020-09-19 MED ORDER — ONDANSETRON HCL 4 MG/2ML IJ SOLN
4.0000 mg | Freq: Four times a day (QID) | INTRAMUSCULAR | Status: DC | PRN
Start: 2020-09-19 — End: 2020-09-20

## 2020-09-19 MED ORDER — DROPERIDOL 2.5 MG/ML IJ SOLN
2.5000 mg | Freq: Once | INTRAMUSCULAR | Status: AC
  Administered 2020-09-19: 2.5 mg via INTRAVENOUS

## 2020-09-19 MED ORDER — SODIUM CHLORIDE 0.9 % IV SOLN: 250.0000 mL | INTRAVENOUS | Status: DC | PRN

## 2020-09-19 MED ORDER — METOCLOPRAMIDE HCL 5 MG/ML IJ SOLN
10.0000 mg | INTRAMUSCULAR | Status: AC
  Filled 2020-09-19: qty 2

## 2020-09-19 MED ORDER — SODIUM CHLORIDE 0.9 % IV BOLUS
500.0000 mL | Freq: Once | INTRAVENOUS | Status: AC
  Administered 2020-09-19: 500 mL via INTRAVENOUS

## 2020-09-19 MED ORDER — METOPROLOL SUCCINATE ER 25 MG PO TB24
25.0000 mg | ORAL_TABLET | Freq: Every day | ORAL | Status: DC
  Administered 2020-09-19: 25 mg via ORAL
  Filled 2020-09-19: qty 1

## 2020-09-19 NOTE — ED Notes (Signed)
Pt back to [facility]

## 2020-09-19 NOTE — ED Notes (Signed)
Assisted pt to bathroom, c/o ongoing dizziness

## 2020-09-19 NOTE — ED Notes (Signed)
Pt up to use bathroom 

## 2020-09-19 NOTE — Plan of Care (Signed)

## 2020-09-19 NOTE — Evaluation (Signed)
Physical Therapy Evaluation Patient Details Name: Jordan Barron MRN: [id number] DOB: Jan 09, 1951 Today's Date: 09/19/2020   History of Present Illness  Pt admitted to [facility] on 09/18/20 under observation for palpitations and dizziness. Pt reports feelings of room spinning that's worse with positional changes. Imaging negative for acute infarct, but revealed Chiari I malformation with cerebellar tonsils extending up to 1cm below the foramen magnum.   Clinical Impression  Pt is a 70 year old F admitted to hospital on 6/11 for vertigo. At baseline, pt is Ind with all ADL's, IADL's, community ambulation without AD, and driving. 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 Due to deficits, pt required CGA for safety with transfers, gait, and stair training, but was able to be Ind with bed mobility. Due to (+) R dix-hallpike, pt performed 2 rounds of R semont maneuver to address R posterior canal BPPV. Pt with improved duration and severity of symptoms post maneuver. However, PT believes that there may be some cerebellar involvement as imaging revealed Chiari Malformation, and pt presents with severe dizziness with slump position, mild gait/balance deficits, N/T in bil feet, and intermittent tremors of BUE and chin; MD notified. Deficits limit the pt's ability to safely and independently perform ADL's, transfer, and ambulate. Pt will benefit from additional acute care PT visit prior to DC to address symptoms and ensure independence with HEP. At this time, PT recommends DC home with referral to OPPT for vestibular rehab if symptoms persist.   Of note, RN states that pt had an episode of ventricular bigeminy during stair training that lasted 1 minute. Pt asymptomatic during mobility. MD notified.     Follow Up Recommendations  Outpatient PT    Equipment Recommendations  None recommended by PT    Recommendations for Other Services       Precautions / Restrictions Precautions Precautions: Fall Precaution Comments: R posterior canal BPPV, chiari I malformation      Mobility  Bed Mobility Overal bed mobility: Independent                  Transfers Overall transfer level: Needs assistance Equipment used: None Transfers: Sit to/from Stand Sit to Stand: Min guard         General transfer comment: CGA for safety to perform STS due to c/o mild dizziness with positional changes  Ambulation/Gait Ambulation/Gait assistance: Min guard Gait Distance (Feet): 160 Feet Assistive device: None       General Gait Details: CGA for safety due to c/o mild dizziness. Pt demonstrates narrow BOS, early reciprocal gait, and slowed cadence.  Stairs Stairs: Yes Stairs assistance: Min guard Stair Management: Two rails;Alternating pattern Number of Stairs: 12 General stair comments: CGA for safety to navigate 12 steps with BUE support on handrail, demonstrating reciprocal gait pattern. Per RN, pt with episode of ventricular bigeminy during stair training however pt asymptomatic.      Balance Overall balance assessment: Needs assistance Sitting-balance support: No upper extremity supported;Feet supported Sitting balance-Leahy Scale: Good     Standing balance support: No upper extremity supported;During functional activity Standing balance-Leahy Scale: Good Standing balance comment: Unsteadiness on feet due to c/o dizziness requiring CGA for safety and handrails with stair training  Pertinent Vitals/Pain Pain Assessment: No/denies pain    Home Living Family/patient expects to be discharged to:: Private residence Living Arrangements: Alone Available Help at Discharge: Family;Available PRN/intermittently Type of Home: Apartment Home Access: Stairs to  enter Entrance Stairs-Rails: Can reach both Entrance Stairs-Number of Steps: 9+9 stairs Home Layout: One level Home Equipment: Shower seat;Grab bars - tub/shower      Prior Function Level of Independence: Independent         Comments: Pt reports being Ind with all ADL's, IADL's, community ambulation without AD, and driving.        Extremity/Trunk Assessment   Upper Extremity Assessment Upper Extremity Assessment: Overall WFL for tasks assessed (grossly 4+/5, sensation intact, coordination intact)    Lower Extremity Assessment Lower Extremity Assessment: Overall WFL for tasks assessed (Grossly 4+/5, sensation grossly intact but pt with c/o tingling in toes, coordination intact)    Cervical / Trunk Assessment Cervical / Trunk Assessment: Normal  Communication   Communication: No difficulties  Cognition Arousal/Alertness: Awake/alert Behavior During Therapy: WFL for tasks assessed/performed Overall Cognitive Status: Within Functional Limits for tasks assessed                                 General Comments: Pt is A&O x4 and able to follow 100% of multistep commands      General Comments General comments (skin integrity, edema, etc.): Vestibular screen performed. Increased dizziness noted with saccades and smooth pursuits; saccades noted during smooth pursuit testing in horizontal plane. R dix-hallpike positive with mild upbeating torsional nystagmus and c/o mild-mod dizziness. L dix-hallpike negative. VOR x1 and head impulse test negative for nystagmus, but pt with increased c/o mild dizziness. Severe dizziness noted in slump position, concerning for cerebellar involvement due to (+) chiari malformation noted on imaging, as she also presents with numbness/tingling in the feet, intermittent tremoring of the UE/chin, and mild balance disturbance.    Exercises Other Exercises Other Exercises: Pt able to participate in bed mobility, transfers, gait, and stair  training today. Pt was Ind for bed mobility, but was provided CGA for safety with transfers, gait, and stair training due to mild balance deficits and c/o mild dizziness. Other Exercises: Vestibular screen performed revealing (+) R dix hallpike test, indicating R posterior canal BPPV. Semont maneuver performed x2, with pt demonstrating improved duration of symptoms and severity of symptoms. Pt provided handout of R Semont Maneuver and R Epley maneuver for self management of symptoms at home. Other Exercises: Pt educated regarding: PT role/POC, DC recommendations, R Semont Maneuver w/ handout, vestibular A&P, vestibular signs/symptoms, possible cerebellar involvement, stair training, safety with mobility. She verbalized understanding of education.   Assessment/Plan    PT Assessment Patient needs continued PT services  PT Problem List Decreased mobility;Decreased balance       PT Treatment Interventions Gait training;Stair training;Functional mobility training;Therapeutic activities;Therapeutic exercise;Balance training;Neuromuscular re-education;Other (comment) (vestibular rehab (R semont vs. Epley))    PT Goals (Current goals can be found in the Care Plan section)  Acute Rehab PT Goals Patient Stated Goal: "to go home" PT Goal Formulation: With patient Time For Goal Achievement: 10/03/20 Potential to Achieve Goals: Good    Frequency Min 2X/week   Barriers to discharge Decreased caregiver support PRN assist from daughter       AM-PAC PT "6 Clicks" Mobility  Outcome Measure Help needed turning from your back to your side while in a flat bed without using bedrails?: None  Help needed moving from lying on your back to sitting on the side of a flat bed without using bedrails?: None Help needed moving to and from a bed to a chair (including a wheelchair)?: A Little Help needed standing up from a chair using your arms (e.g., wheelchair or bedside chair)?: A Little Help needed to walk in  hospital room?: A Little Help needed climbing 3-5 steps with a railing? : A Little 6 Click Score: 20    End of Session Equipment Utilized During Treatment: Gait belt Activity Tolerance: Patient tolerated treatment well Patient left: in bed;with call bell/phone within reach;with bed alarm set Nurse Communication: Mobility status PT Visit Diagnosis: Unsteadiness on feet (R26.81);BPPV;Dizziness and giddiness (R42);Difficulty in walking, not elsewhere classified (R26.2) BPPV - Right/Left : Right    Time: 3875-6433 PT Time Calculation (min) (ACUTE ONLY): 59 min   Charges:   PT Evaluation $PT Eval Moderate Complexity: 1 Mod PT Treatments $Therapeutic Activity: 8-22 mins $Canalith Rep Proc: 8-22 mins       Vira Blanco, PT, DPT 3:42 PM,09/19/20

## 2020-09-19 NOTE — Progress Notes (Signed)
Pt had 1 minute of ventricular bigemeny at 1358 while working with PT.  Is now SR with BBB.  Provider notified via secure chat.

## 2020-09-19 NOTE — Consult Note (Addendum)
Neurology Consultation Reason for Consult: Dizziness Requesting Physician: Tochukwu Agbata  CC: Dizziness triggered by position change  History is obtained from: Patient and chart review  HPI: Jordan Barron is a 70 y.o. female with a past medical history significant for paroxysmal SVT (managed with metoprolol, followed by cardiology), hyperlipidemia, essential hypertension with additional overlying whitecoat hypertension in healthcare settings, diabetes (A1c 6.5% 06/24/2020)  Notably she presented to her PCP on 06/24/2020 with complaint of fatigue and lightheadedness for 5 weeks which started after returning home from a 2-week trip to [place] to take care of her ailing mother.  She had fatigue for 2 weeks immediately on her return which she attributed to emotional stress, and while the fatigue improved she continued to feel lightheaded intermittently with associated nausea when standing.  She was also having seasonal allergies and left-sided sinus headache but denied dizziness.  Blood pressure was noted to be 196/99 but "always high at the doctor's office" by the time of follow-up with cardiology on 07/14/2020 she felt back to baseline.  They noted home blood pressure readings are predominantly in the 140s occasionally down to 130s or up to 150s systolic, however the device was noted to be in wrist sphygmomanometer and she was advised to obtain a brachial blood pressure cuff for more accurate measurements with which to guide medication titration  She tells me she had laid down for nap and when she awoke and tried to get out of that she had severe dizziness as well as palpitations.  Her palpitations resolved with Valsalva maneuvers with the dizziness was persistent and exacerbated by any motion, and felt like the room is spinning around her.  If she laid still for a while the dizziness resolves.  At the time of my interview, laying still in the bed for several hours she is not having any dizziness  LKW: 6  PM 6/11 tPA given?: No, not a stroke   Premorbid modified rankin scale:     0 - No symptoms.  ROS: All other review of systems was negative except as noted in the HPI.  Specifically she denied any double vision, new focal numbness/weakness (has some bilateral numbness in her toes that is chronic), fever/chills/infectious symptoms, rashes/skin changes   Past Medical History:  Diagnosis Date   Hypertension    Paroxysmal SVT (supraventricular tachycardia) (HCC)    Past Surgical History:  Procedure Laterality Date   ABDOMINAL HYSTERECTOMY       Family History  Problem Relation Age of Onset   Other Mother        pacemaker   Current Outpatient Medications  Medication Instructions   ascorbic acid (VITAMIN C) 500 mg, Oral, Daily   aspirin EC 81 mg, Oral, Daily   fluticasone (FLONASE) 50 MCG/ACT nasal spray 2 sprays, Each Nare, Daily PRN   lisinopril (ZESTRIL) 30 mg, Oral, Daily   Metoprolol Succinate 25 mg, Oral, Daily at bedtime   metoprolol tartrate (LOPRESSOR) 25 mg, Oral, 2 times daily PRN   Multiple Vitamin (MULTIVITAMIN) capsule 1 capsule, Oral, Daily   pantoprazole (PROTONIX) 20 mg, Oral, 2 times daily     Social History:  reports that she has never smoked. She has never used smokeless tobacco. She reports that she does not drink alcohol and does not use drugs.  Exam: Current vital signs: BP (!) 176/70   Pulse 75   Temp 98.8 F (37.1 C) (Oral)   Resp 14   Ht 5\' 10"  (1.778 m)   Wt 97.5 kg  SpO2 97%   BMI 30.85 kg/m  Vital signs in last 24 hours: Temp:  [98.8 F (37.1 C)] 98.8 F (37.1 C) (06/11 2258) Pulse Rate:  [62-82] 75 (06/12 0900) Resp:  [14-24] 14 (06/12 0830) BP: (158-206)/(61-99) 176/70 (06/12 0900) SpO2:  [94 %-99 %] 97 % (06/12 0900) Weight:  [97.5 kg] 97.5 kg (06/11 2259)   Physical Exam  Constitutional: Appears well-developed and well-nourished.  Psych: Affect appropriate to situation, mildly anxious initially but calm and cooperative  overall Eyes: No scleral injection HENT: No oropharyngeal obstruction.  MSK: no joint deformities.  Cardiovascular: Normal rate and regular rhythm.  Respiratory: Effort normal, non-labored breathing GI: Soft.  No distension. There is no tenderness.  Skin: Warm dry and intact visible skin  Neuro: Mental Status: Patient is awake, alert, oriented to person, place, month, year, and situation. Patient is able to give a clear and coherent history. No signs of aphasia or neglect Cranial Nerves: II: Visual Fields are full. Pupils are equal, round, and reactive to light.   III,IV, VI: EOMI without ptosis or diploplia.  V: Facial sensation is symmetric to temperature VII: Facial movement is symmetric.  VIII: hearing is intact to voice X: Uvula elevates symmetrically XI: Shoulder shrug is symmetric. XII: tongue is midline without atrophy or fasciculations.    Dix-Hallpike with head turn to the left resulted in no symptoms and no nystagmus Dix-Hallpike with head turn to the right resulted in dizziness and nystagmus which appeared after short latency and gradually resolved.  The patient did have some difficulty keeping her eyes open which made it difficult to fully characterize the direction of the nystagmus.  Epley maneuver was then performed again eliciting some dizziness.  After these maneuvers the patient reported some improvement in her symptoms though not full resolution  Motor: Tone is normal. Bulk is normal. 5/5 strength was present in all four extremities other than mild bilateral hip flexor weakness Sensory: Sensation is symmetric to light touch and temperature in the arms and legs, with some length dependent loss of temperature in all 4 extremities Deep Tendon Reflexes: 2+ and symmetric in the biceps and patellae.  Plantars: Toes are downgoing bilaterally.  Cerebellar: FNF and HKS are intact bilaterally   I have reviewed labs in epic and the results pertinent to this  consultation are: Cr 0.58, Glu 129, BMP WNL CBC with Hgb 13.2,  Covid/flu neg  MRI brain and MRA head, personally reviewed and agree with radiology:  MRI HEAD IMPRESSION:  1. No acute intracranial abnormality. 2. 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 3. Underlying moderate chronic microvascular ischemic disease.   MRA HEAD IMPRESSION:  Normal intracranial MRA. No large vessel occlusion, hemodynamically significant stenosis, or other acute vascular abnormality.    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  Unclear if the palpitations she experienced were truly an SVT episode or a subjective sensation secondary to BPPV.  Defer to cardiology for further evaluation of this  Recommendations: -Vestibular PT evaluation for self Epley maneuver teaching and discharge physical therapy recommendations -Minimize use of meclizine -Outpatient PCP follow-up -Neurology will be available on an as-needed basis going forward, please reach out if new questions or concerns arise  Brooke Dare MD-PhD [facility] [phone] [facility] coverage for [facility] is  from 8 AM to 4 AM in-house and 4 PM to 8 PM by telephone/video. 8 PM to 8 AM emergent questions or overnight urgent questions should be addressed to Teleneurology On-call or [facility] neurohospitalist; contact information can be found on AMION  115 minutes were spent in evaluation of this patient today, greater than 50% at bedside obtaining history and performing full neurological examination including Dix-Hallpike and Epley as documented above.

## 2020-09-19 NOTE — ED Provider Notes (Signed)
[facility] Emergency Department Provider Note  ____________________________________________   Event Date/Time   First MD Initiated Contact with Patient 09/18/20 2339     (approximate)  I have reviewed the triage vital signs and the nursing notes.   HISTORY  Chief Complaint Palpitations and Dizziness    HPI Jordan Barron is a 70 y.o. female with medical history as listed below who presents for evaluation of dizziness.  She reports that she believes that she had an episode of SVT around 6 PM because she was feeling palpitations and onset of dizziness.  She says she has had dizziness in the past and she is quite familiar with SVT and currently takes metoprolol for it.  She was able to bear down and take an extra metoprolol tartrate and that made the palpitations go away, but her dizziness has persisted and has gradually gotten worse.  She said that the symptoms are worse if she lies down flat or moves around.  She feels like the room is spinning.  She has had no nausea nor vomiting.  She has difficulty ambulating because of the dizziness.  No visual changes.  No weakness or numbness in her extremities.  No word finding difficulties or facial droop.  Symptoms were acute in onset around 6 PM at about the same time she had the palpitations but it is unusual for the dizziness to persist this long (5 to 6 hours) after the onset of the palpitations.  Nothing in particular seems to make the symptoms better.  She denies headache, neck pain, chest pain, shortness of breath, nausea, vomiting, abdominal pain, and dysuria.  No recent trauma.  No history of CVA.  She does not take blood thinner other than an aspirin.     Past Medical History:  Diagnosis Date   Hypertension    Paroxysmal SVT (supraventricular tachycardia) [facility])     Patient Active Problem List   Diagnosis Date Noted   PSVT (paroxysmal supraventricular tachycardia) (HCC) 09/10/2014   Essential  hypertension 09/10/2014    Past Surgical History:  Procedure Laterality Date   ABDOMINAL HYSTERECTOMY      Prior to Admission medications   Medication Sig Start Date End Date Taking? Authorizing Provider  aspirin EC 81 MG tablet Take 81 mg by mouth daily. Taking 1 tablet daily    [provider]  atorvastatin (LIPITOR) 20 MG tablet Take 20 mg by mouth daily. Taking 1 tablet daily Patient not taking: No sig reported    [provider]  lisinopril (ZESTRIL) 30 MG tablet lisinopril 30 mg tablet  TAKE 1 TABLET BY MOUTH ONCE DAILY FOR 90 DAYS    [provider]  loratadine (CLARITIN) 10 MG tablet Take 10 mg by mouth daily as needed for allergies.     [provider]  METOPROLOL SUCCINATE PO Take 25 mg by mouth every morning.    [provider]  metoprolol tartrate (LOPRESSOR) 25 MG tablet Take 1 tablet (25 mg total) by mouth 2 (two) times daily as needed. When heart rate remains elevated at 110 or greater. 09/09/19 12/08/19  Sondra Barges, PA-C  Multiple Vitamin (MULTIVITAMIN) capsule Take 1 capsule by mouth daily.    [provider]  pantoprazole (PROTONIX) 20 MG tablet Take 20 mg by mouth daily as needed for heartburn or indigestion.  04/13/15   [provider]    Allergies Patient has no known allergies.  Family History  Problem Relation Age of Onset   Other Mother  pacemaker    Social History Social History   Tobacco Use   Smoking status: Never   Smokeless tobacco: Never  Substance Use Topics   Alcohol use: No   Drug use: No    Review of Systems Constitutional: No fever/chills Eyes: No visual changes. ENT: No sore throat. Cardiovascular: Denies chest pain, but positive for palpitations earlier tonight which have since resolved. Respiratory: Denies shortness of breath. Gastrointestinal: No abdominal pain.  No nausea, no vomiting.  No diarrhea.  No constipation. Genitourinary: Negative for  dysuria. Musculoskeletal: Negative for neck pain.  Negative for back pain. Integumentary: Negative for rash. Neurological: Positive for "dizziness" (vertiginous feeling of room spinning, worse with positional change).   ____________________________________________   PHYSICAL EXAM:  VITAL SIGNS: ED Triage Vitals  Enc Vitals Group     BP 09/18/20 2258 (!) 206/84     Pulse Rate 09/18/20 2258 73     Resp 09/18/20 2258 20     Temp 09/18/20 2258 98.8 F (37.1 C)     Temp Source 09/18/20 2258 Oral     SpO2 09/18/20 2258 99 %     Weight 09/18/20 2259 97.5 kg (215 lb)     Height 09/18/20 2259 1.778 m ( )     Head Circumference --      Peak Flow --      Pain Score 09/18/20 2258 0     Pain Loc --      Pain Edu? --      Excl. in GC? --     Constitutional: Alert and oriented.  Eyes: Conjunctivae are normal.  Pupils are equal and reactive.  No nystagmus. Head: Atraumatic. Nose: No congestion/rhinnorhea. Mouth/Throat: Patient is wearing a mask. Neck: No stridor.  No meningeal signs.   Cardiovascular: Normal rate, regular rhythm. Good peripheral circulation. Respiratory: Normal respiratory effort.  No retractions. Gastrointestinal: Soft and nontender. No distention.  Musculoskeletal: No lower extremity tenderness nor edema. No gross deformities of extremities. Neurologic:  Normal speech and language. No gross focal neurologic deficits are appreciated.  Good muscle groups strength in upper and lower extremities.  No dysmetria with finger-to-nose testing.  No dysarthria, dysphagia, nor aphasia.  Gait not tested as result of the patient's vertigo. Skin:  Skin is warm, dry and intact. Psychiatric: Mood and affect are normal. Speech and behavior are normal.  ____________________________________________   LABS (all labs ordered are listed, but only abnormal results are displayed)  Labs Reviewed  BASIC METABOLIC PANEL - Abnormal; Notable for the following components:      Result  Value   Glucose, Bld 129 (*)    All other components within normal limits  CBC - Abnormal; Notable for the following components:   RBC 5.89 (*)    MCV 69.9 (*)    MCH 22.4 (*)    RDW 16.0 (*)    All other components within normal limits  RESP PANEL BY RT-PCR (FLU A&B, COVID) ARPGX2  TROPONIN I (HIGH SENSITIVITY)  TROPONIN I (HIGH SENSITIVITY)   ____________________________________________  EKG  ED ECG REPORT I, Loleta Rose, the attending physician, personally viewed and interpreted this ECG.  Date: 09/18/2020 EKG Time: 22:58 Rate: 75 Rhythm: normal sinus rhythm QRS Axis: normal Intervals: Left anterior fascicular block, LVH with some QRS widening ST/T Wave abnormalities: normal Narrative Interpretation: no evidence of acute ischemia  ____________________________________________  RADIOLOGY I, Loleta Rose, personally viewed and evaluated these images (plain radiographs) as part of my medical decision making, as well as reviewing the written  report by the radiologist.  ED MD interpretation:  No acute abnormalities on MRI/MRA of brain/head  Official radiology report(s): MR ANGIO HEAD WO CONTRAST  Result Date: 09/19/2020 CLINICAL DATA:  Initial evaluation for acute ataxia, stroke suspected. EXAM: MRI HEAD WITHOUT CONTRAST MRA HEAD WITHOUT CONTRAST TECHNIQUE: Multiplanar, multi-echo pulse sequences of the brain and surrounding structures were acquired without intravenous contrast. Angiographic images of the Circle of Willis were acquired using MRA technique without intravenous contrast. COMPARISON: No pertinent prior exam. COMPARISON:  Prior head CT from 05/10/2015. FINDINGS: MRI HEAD FINDINGS Brain: Cerebral volume within normal limits for age. Scattered patchy T2/FLAIR hyperintensity within the periventricular deep white matter both cerebral hemispheres, most likely related chronic microvascular ischemic disease, moderate in nature. No abnormal foci of restricted diffusion to  suggest acute or subacute ischemia. Gray-white matter differentiation maintained. No encephalomalacia to suggest chronic cortical infarction. No foci of susceptibility artifact to suggest acute or chronic intracranial hemorrhage. No mass lesion, midline shift or mass effect. No hydrocephalus. Absence of the septum pellucidum noted. No extra-axial fluid collection. Pituitary gland suprasellar region normal. Midline structures intact. Vascular: Major intracranial vascular flow voids are within normal limits. Skull and upper cervical spine: Cerebellar tonsils are beaked and extend up to 1 cm below the foramen magnum, consistent with Chiari 1 malformation. No visible syrinx within the upper cervical spinal cord. Bone marrow signal intensity within normal limits. No scalp soft tissue abnormality. Sinuses/Orbits: Globes and orbital soft tissues within normal limits. Paranasal sinuses are largely clear. No mastoid effusion. Inner ear structures grossly normal. Other: None. MRA HEAD FINDINGS Anterior circulation: Examination mildly degraded by motion artifact. Visualized distal cervical segments of the internal carotid arteries are patent with symmetric antegrade flow. Petrous, cavernous, and supraclinoid segments widely patent without stenosis or other abnormality. A1 segments patent bilaterally. Normal anterior communicating artery complex. Anterior cerebral arteries patent to their distal aspects without stenosis. No M1 stenosis or occlusion. Normal MCA bifurcations. Distal MCA branches well perfused and symmetric. Posterior circulation: Both vertebral arteries patent to the vertebrobasilar junction without stenosis. Right vertebral artery dominant. Neither PICA origin well visualized. Basilar patent to its distal aspect without stenosis. Superior cerebellar arteries patent bilaterally. Left PCA supplied via the basilar. Right PCA supplied via the basilar as well as a prominent right posterior communicating artery. Both  PCAs well perfused to their distal aspects without stenosis. No intracranial aneurysm. Anatomic variants: Dominant right vertebral artery. IMPRESSION: MRI HEAD IMPRESSION: 1. No acute intracranial abnormality. 2. 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 3. Underlying moderate chronic microvascular ischemic disease. MRA HEAD IMPRESSION: Normal intracranial MRA. No large vessel occlusion, hemodynamically significant stenosis, or other acute vascular abnormality. Electronically Signed   By: Rise Mu M.D.   On: 09/19/2020 04:00   MR BRAIN WO CONTRAST  Result Date: 09/19/2020 CLINICAL DATA:  Initial evaluation for acute ataxia, stroke suspected. EXAM: MRI HEAD WITHOUT CONTRAST MRA HEAD WITHOUT CONTRAST TECHNIQUE: Multiplanar, multi-echo pulse sequences of the brain and surrounding structures were acquired without intravenous contrast. Angiographic images of the Circle of Willis were acquired using MRA technique without intravenous contrast. COMPARISON: No pertinent prior exam. COMPARISON:  Prior head CT from 05/10/2015. FINDINGS: MRI HEAD FINDINGS Brain: Cerebral volume within normal limits for age. Scattered patchy T2/FLAIR hyperintensity within the periventricular deep white matter both cerebral hemispheres, most likely related chronic microvascular ischemic disease, moderate in nature. No abnormal foci of restricted diffusion to suggest acute or subacute ischemia.  Gray-white matter differentiation maintained. No encephalomalacia to suggest chronic cortical infarction. No foci of susceptibility artifact to suggest acute or chronic intracranial hemorrhage. No mass lesion, midline shift or mass effect. No hydrocephalus. Absence of the septum pellucidum noted. No extra-axial fluid collection. Pituitary gland suprasellar region normal. Midline structures intact. Vascular: Major intracranial vascular flow voids are within normal limits. Skull and upper  cervical spine: Cerebellar tonsils are beaked and extend up to 1 cm below the foramen magnum, consistent with Chiari 1 malformation. No visible syrinx within the upper cervical spinal cord. Bone marrow signal intensity within normal limits. No scalp soft tissue abnormality. Sinuses/Orbits: Globes and orbital soft tissues within normal limits. Paranasal sinuses are largely clear. No mastoid effusion. Inner ear structures grossly normal. Other: None. MRA HEAD FINDINGS Anterior circulation: Examination mildly degraded by motion artifact. Visualized distal cervical segments of the internal carotid arteries are patent with symmetric antegrade flow. Petrous, cavernous, and supraclinoid segments widely patent without stenosis or other abnormality. A1 segments patent bilaterally. Normal anterior communicating artery complex. Anterior cerebral arteries patent to their distal aspects without stenosis. No M1 stenosis or occlusion. Normal MCA bifurcations. Distal MCA branches well perfused and symmetric. Posterior circulation: Both vertebral arteries patent to the vertebrobasilar junction without stenosis. Right vertebral artery dominant. Neither PICA origin well visualized. Basilar patent to its distal aspect without stenosis. Superior cerebellar arteries patent bilaterally. Left PCA supplied via the basilar. Right PCA supplied via the basilar as well as a prominent right posterior communicating artery. Both PCAs well perfused to their distal aspects without stenosis. No intracranial aneurysm. Anatomic variants: Dominant right vertebral artery. IMPRESSION: MRI HEAD IMPRESSION: 1. No acute intracranial abnormality. 2. 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 3. Underlying moderate chronic microvascular ischemic disease. MRA HEAD IMPRESSION: Normal intracranial MRA. No large vessel occlusion, hemodynamically significant stenosis, or other acute vascular abnormality.  Electronically Signed   By: Rise MuBenjamin  McClintock M.D.   On: 09/19/2020 04:00    ____________________________________________   PROCEDURES   Procedure(s) performed (including Critical Care):  .1-3 Lead EKG Interpretation  Date/Time: 09/19/2020 12:05 AM Performed by: Loleta RoseForbach, Alnita Aybar, MD Authorized by: Loleta RoseForbach, Chukwudi Ewen, MD     Interpretation: normal     ECG rate:  72   ECG rate assessment: normal     Rhythm: sinus rhythm     Ectopy: none     Conduction: normal     ____________________________________________   INITIAL IMPRESSION / MDM / ASSESSMENT AND PLAN / ED COURSE  As part of my medical decision making, I reviewed the following data within the electronic MEDICAL RECORD NUMBER History obtained from family, Nursing notes reviewed and incorporated, Labs reviewed , EKG interpreted , Old chart reviewed, Discussed with admitting physician , and Notes from prior ED visits   Differential diagnosis includes, but is not limited to, peripheral vertigo, central vertigo (CVA), electrolyte or metabolic abnormality, less likely infectious disease or ACS.  The patient is on the cardiac monitor to evaluate for evidence of arrhythmia and/or significant heart rate changes.  Patient has known SVT and believes she had SVT earlier but that has completely resolved.  EKG is reassuring and she has not had any chest pain.  Her vital signs are notable for hypertension but she said her blood pressure is always very high particular when she goes to the doctor's office.  However this also could be a compensatory measure if she is having a CVA.  Her symptoms seem relatively  mild as opposed to the profound presentation that often accompanies peripheral vertigo; she is not having any nausea or vomiting and the symptoms started around the time of the palpitations.  I have a relatively low suspicion for CVA but I think it is reasonable to evaluate for it.  I am giving her a dose of meclizine 25 mg p.o. to help with the  symptoms but we talked about an MRI and she is in agreement.  Given the possibility of posterior circulation impairment as the cause of her vertigo and ambulatory difficulties, I am checking an MRA head as well as MRI brain.  Initial high-sensitivity troponin is normal.  CBC is normal.  Basic metabolic panel is normal.     Clinical Course as of 09/19/20 0521  Sun Sep 19, 2020  0049 When patient was prone for the MRI her symptoms became much worse and she was not able to tolerate holding still for the imaging.  She was brought back to her room prior to me being aware of the situation.  We talked about the importance of getting the images and I feel that the benefit of getting the images outweighs the risk of additional medication.  Droperidol is indicated for nausea and vertigo and I think it is appropriate.  She has a QTC of 462 ms but the low risk of QTC prolongation outweighs the greater risk, in my opinion, providing medication such as a benzodiazepine in the elderly population.  I ordered droperidol 2.5 mg IV to be administered prior to another attempt at MRI. [CF]  0140 Troponin I (High Sensitivity): 5 Second HS troponin is normal. [CF]  0501 Patient's MRIs are reassuring with no evidence of acute stroke.  However the patient says she is still feeling quite dizzy.  She got up to go the bathroom and could barely stand.  She said that the medications she received really helped a little bit but not much.  At this point unfortunately do not think she is going to be able to go home.  I am going to give her a small fluid bolus of 500 mL normal saline as well as a dose of metoclopramide 10 mg IV and consult the hospitalist for admission.  The patient and her sister, who is now at bedside, agrees with this plan. [CF]  [phone] Discussed with the hospitalist who will admit. [CF]    Clinical Course User Index [CF] Loleta Rose, MD     ____________________________________________  FINAL CLINICAL  IMPRESSION(S) / ED DIAGNOSES  Final diagnoses:  Peripheral positional vertigo, unspecified laterality     MEDICATIONS GIVEN DURING THIS VISIT:  Medications  metoCLOPramide (REGLAN) injection 10 mg (has no administration in time range)  sodium chloride 0.9 % bolus 500 mL (has no administration in time range)  meclizine (ANTIVERT) tablet 25 mg (25 mg Oral Given 09/19/20 0008)  droperidol (INAPSINE) 2.5 MG/ML injection 2.5 mg (2.5 mg Intravenous Given 09/19/20 0059)     ED Discharge Orders     None        Note:  This document was prepared using Dragon voice recognition software and may include unintentional dictation errors.   Loleta Rose, MD 09/19/20 [phone]

## 2020-09-19 NOTE — H&P (Signed)
History and Physical    Jordan Barron ZOX:[id number] DOB: 09/06/1950 DOA: 09/18/2020  PCP: Lindaann PascalLong, Scott, PA-C   Patient coming from: Home  I have personally briefly reviewed patient[facility]  Chief Complaint: Dizziness  HPI: Jordan DeutscherMardia D Barron is a 70 y.o. female with medical history significant for paroxysmal supraventricular tachycardia, hypertension who presents to the ER for evaluation of dizziness.  Patient states that she has a history of intermittent palpitations and had an episode last night where she developed palpitations and then started feeling dizzy.  She took metoprolol at home for palpitations as prescribed. Dizziness is worse with any form of movement also associated with nausea but no vomiting.  She denies having any tinnitus or hearing loss and has not had any falls.  Patient states that her palpitations have resolved but she is still very dizzy. She denies having any headache, no neck pain, no chest pain, no shortness of breath, no nausea, no vomiting, no abdominal pain, no fever, no chills, no urinary symptoms or changes in her bowel habits. Labs show sodium 136, potassium 4.2, chloride 106, bicarb 25, glucose 129, BUN 16, creatinine 0.58, calcium 9.6, troponin 4, white count 8.9, hemoglobin 13.2, hematocrit 41.2, MCV 69, RDW 16, platelet count 330 Respiratory viral panel is negative MRI of the brain showed no acute intracranial abnormality.  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  Underlying moderate chronic microvascular ischemic disease.   MRI of the brain is normal Twelve-lead EKG reviewed by me shows normal sinus rhythm with LVH.    ED Course: Patient is a 70 year old female who presents to the ER for evaluation of dizziness which started abruptly at about 6 PM the evening prior to her admission. She also had associated palpitations which has resolved. Patient continues to have  dizziness which is worse with movement and not relieved by medications administered in the ER. She will be referred to observation status for further evaluation.    Review of Systems: As per HPI otherwise all other systems reviewed and negative.    Past Medical History:  Diagnosis Date   Hypertension    Paroxysmal SVT (supraventricular tachycardia) (HCC)     Past Surgical History:  Procedure Laterality Date   ABDOMINAL HYSTERECTOMY       reports that she has never smoked. She has never used smokeless tobacco. She reports that she does not drink alcohol and does not use drugs.  No Known Allergies  Family History  Problem Relation Age of Onset   Other Mother        pacemaker      Prior to Admission medications   Medication Sig Start Date End Date Taking? Authorizing Provider  aspirin EC 81 MG tablet Take 81 mg by mouth daily. Taking 1 tablet daily    [provider]  atorvastatin (LIPITOR) 20 MG tablet Take 20 mg by mouth daily. Taking 1 tablet daily Patient not taking: No sig reported    [provider]  lisinopril (ZESTRIL) 30 MG tablet lisinopril 30 mg tablet  TAKE 1 TABLET BY MOUTH ONCE DAILY FOR 90 DAYS    [provider]  loratadine (CLARITIN) 10 MG tablet Take 10 mg by mouth daily as needed for allergies.     [provider]  METOPROLOL SUCCINATE PO Take 25 mg by mouth every morning.    [provider]  metoprolol tartrate (LOPRESSOR) 25 MG tablet Take 1  tablet (25 mg total) by mouth 2 (two) times daily as needed. When heart rate remains elevated at 110 or greater. 09/09/19 12/08/19  Sondra Barges, PA-C  Multiple Vitamin (MULTIVITAMIN) capsule Take 1 capsule by mouth daily.    [provider]  pantoprazole (PROTONIX) 20 MG tablet Take 20 mg by mouth daily as needed for heartburn or indigestion.  04/13/15   [provider]    Physical Exam: Vitals:   09/19/20 0530 09/19/20 0630 09/19/20 0700 09/19/20 0800  BP:  (!) 168/78 (!) 174/68 (!) 161/62 (!) 174/82  Pulse: 82 72 73 70  Resp: 16 16 14 14   Temp:      TempSrc:      SpO2: 99% 94% 97% 99%  Weight:      Height:         Vitals:   09/19/20 0530 09/19/20 0630 09/19/20 0700 09/19/20 0800  BP: (!) 168/78 (!) 174/68 (!) 161/62 (!) 174/82  Pulse: 82 72 73 70  Resp: 16 16 14 14   Temp:      TempSrc:      SpO2: 99% 94% 97% 99%  Weight:      Height:          Constitutional: Alert and oriented x 3 . Not in any apparent distress HEENT:      Head: Normocephalic and atraumatic.         Eyes: PERLA, EOMI, Conjunctivae are normal. Sclera is non-icteric.       Mouth/Throat: Mucous membranes are moist.       Neck: Supple with no signs of meningismus. Cardiovascular: Regular rate and rhythm. No murmurs, gallops, or rubs. 2+ symmetrical distal pulses are present . No JVD. No LE edema Respiratory: Respiratory effort normal .Lungs sounds clear bilaterally. No wheezes, crackles, or rhonchi.  Gastrointestinal: Soft, non tender, and non distended with positive bowel sounds.  Genitourinary: No CVA tenderness. Musculoskeletal: Nontender with normal range of motion in all extremities. No cyanosis, or erythema of extremities. Neurologic:  Face is symmetric. Moving all extremities. No gross focal neurologic deficits . Skin: Skin is warm, dry.  No rash or ulcers Psychiatric: Mood and affect are normal    Labs on Admission: I have personally reviewed following labs and imaging studies  CBC: Recent Labs  Lab 09/18/20 2302  WBC 8.9  HGB 13.2  HCT 41.2  MCV 69.9*  PLT 330   Basic Metabolic Panel: Recent Labs  Lab 09/18/20 2302  NA 136  K 4.2  CL 106  CO2 25  GLUCOSE 129*  BUN 16  CREATININE 0.58  CALCIUM 9.6   GFR: Estimated Creatinine Clearance: 80.4 mL/min (by C-G formula based on SCr of 0.58 mg/dL). Liver Function Tests: No results for input(s): AST, ALT, ALKPHOS, BILITOT, PROT, ALBUMIN in the last 168 hours. No results for input(s):  LIPASE, AMYLASE in the last 168 hours. No results for input(s): AMMONIA in the last 168 hours. Coagulation Profile: No results for input(s): INR, PROTIME in the last 168 hours. Cardiac Enzymes: No results for input(s): CKTOTAL, CKMB, CKMBINDEX, TROPONINI in the last 168 hours. BNP (last 3 results) No results for input(s): PROBNP in the last 8760 hours. HbA1C: No results for input(s): HGBA1C in the last 72 hours. CBG: No results for input(s): GLUCAP in the last 168 hours. Lipid Profile: No results for input(s): CHOL, HDL, LDLCALC, TRIG, CHOLHDL, LDLDIRECT in the last 72 hours. Thyroid Function Tests: No results for input(s): TSH, T4TOTAL, FREET4, T3FREE, THYROIDAB in the last 72 hours.  Anemia Panel: No results for input(s): VITAMINB12, FOLATE, FERRITIN, TIBC, IRON, RETICCTPCT in the last 72 hours. Urine analysis: No results found for: COLORURINE, APPEARANCEUR, LABSPEC, PHURINE, GLUCOSEU, HGBUR, BILIRUBINUR, KETONESUR, PROTEINUR, UROBILINOGEN, NITRITE, LEUKOCYTESUR  Radiological Exams on Admission: MR ANGIO HEAD WO CONTRAST  Result Date: 09/19/2020 CLINICAL DATA:  Initial evaluation for acute ataxia, stroke suspected. EXAM: MRI HEAD WITHOUT CONTRAST MRA HEAD WITHOUT CONTRAST TECHNIQUE: Multiplanar, multi-echo pulse sequences of the brain and surrounding structures were acquired without intravenous contrast. Angiographic images of the Circle of Willis were acquired using MRA technique without intravenous contrast. COMPARISON: No pertinent prior exam. COMPARISON:  Prior head CT from 05/10/2015. FINDINGS: MRI HEAD FINDINGS Brain: Cerebral volume within normal limits for age. Scattered patchy T2/FLAIR hyperintensity within the periventricular deep white matter both cerebral hemispheres, most likely related chronic microvascular ischemic disease, moderate in nature. No abnormal foci of restricted diffusion to suggest acute or subacute ischemia. Gray-white matter differentiation maintained. No  encephalomalacia to suggest chronic cortical infarction. No foci of susceptibility artifact to suggest acute or chronic intracranial hemorrhage. No mass lesion, midline shift or mass effect. No hydrocephalus. Absence of the septum pellucidum noted. No extra-axial fluid collection. Pituitary gland suprasellar region normal. Midline structures intact. Vascular: Major intracranial vascular flow voids are within normal limits. Skull and upper cervical spine: Cerebellar tonsils are beaked and extend up to 1 cm below the foramen magnum, consistent with Chiari 1 malformation. No visible syrinx within the upper cervical spinal cord. Bone marrow signal intensity within normal limits. No scalp soft tissue abnormality. Sinuses/Orbits: Globes and orbital soft tissues within normal limits. Paranasal sinuses are largely clear. No mastoid effusion. Inner ear structures grossly normal. Other: None. MRA HEAD FINDINGS Anterior circulation: Examination mildly degraded by motion artifact. Visualized distal cervical segments of the internal carotid arteries are patent with symmetric antegrade flow. Petrous, cavernous, and supraclinoid segments widely patent without stenosis or other abnormality. A1 segments patent bilaterally. Normal anterior communicating artery complex. Anterior cerebral arteries patent to their distal aspects without stenosis. No M1 stenosis or occlusion. Normal MCA bifurcations. Distal MCA branches well perfused and symmetric. Posterior circulation: Both vertebral arteries patent to the vertebrobasilar junction without stenosis. Right vertebral artery dominant. Neither PICA origin well visualized. Basilar patent to its distal aspect without stenosis. Superior cerebellar arteries patent bilaterally. Left PCA supplied via the basilar. Right PCA supplied via the basilar as well as a prominent right posterior communicating artery. Both PCAs well perfused to their distal aspects without stenosis. No intracranial  aneurysm. Anatomic variants: Dominant right vertebral artery. IMPRESSION: MRI HEAD IMPRESSION: 1. No acute intracranial abnormality. 2. 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 3. Underlying moderate chronic microvascular ischemic disease. MRA HEAD IMPRESSION: Normal intracranial MRA. No large vessel occlusion, hemodynamically significant stenosis, or other acute vascular abnormality. Electronically Signed   By: Rise Mu M.D.   On: 09/19/2020 04:00   MR BRAIN WO CONTRAST  Result Date: 09/19/2020 CLINICAL DATA:  Initial evaluation for acute ataxia, stroke suspected. EXAM: MRI HEAD WITHOUT CONTRAST MRA HEAD WITHOUT CONTRAST TECHNIQUE: Multiplanar, multi-echo pulse sequences of the brain and surrounding structures were acquired without intravenous contrast. Angiographic images of the Circle of Willis were acquired using MRA technique without intravenous contrast. COMPARISON: No pertinent prior exam. COMPARISON:  Prior head CT from 05/10/2015. FINDINGS: MRI HEAD FINDINGS Brain: Cerebral volume within normal limits for age. Scattered patchy T2/FLAIR hyperintensity within the periventricular deep white matter both cerebral hemispheres, most likely  related chronic microvascular ischemic disease, moderate in nature. No abnormal foci of restricted diffusion to suggest acute or subacute ischemia. Gray-white matter differentiation maintained. No encephalomalacia to suggest chronic cortical infarction. No foci of susceptibility artifact to suggest acute or chronic intracranial hemorrhage. No mass lesion, midline shift or mass effect. No hydrocephalus. Absence of the septum pellucidum noted. No extra-axial fluid collection. Pituitary gland suprasellar region normal. Midline structures intact. Vascular: Major intracranial vascular flow voids are within normal limits. Skull and upper cervical spine: Cerebellar tonsils are beaked and extend up to 1 cm below the  foramen magnum, consistent with Chiari 1 malformation. No visible syrinx within the upper cervical spinal cord. Bone marrow signal intensity within normal limits. No scalp soft tissue abnormality. Sinuses/Orbits: Globes and orbital soft tissues within normal limits. Paranasal sinuses are largely clear. No mastoid effusion. Inner ear structures grossly normal. Other: None. MRA HEAD FINDINGS Anterior circulation: Examination mildly degraded by motion artifact. Visualized distal cervical segments of the internal carotid arteries are patent with symmetric antegrade flow. Petrous, cavernous, and supraclinoid segments widely patent without stenosis or other abnormality. A1 segments patent bilaterally. Normal anterior communicating artery complex. Anterior cerebral arteries patent to their distal aspects without stenosis. No M1 stenosis or occlusion. Normal MCA bifurcations. Distal MCA branches well perfused and symmetric. Posterior circulation: Both vertebral arteries patent to the vertebrobasilar junction without stenosis. Right vertebral artery dominant. Neither PICA origin well visualized. Basilar patent to its distal aspect without stenosis. Superior cerebellar arteries patent bilaterally. Left PCA supplied via the basilar. Right PCA supplied via the basilar as well as a prominent right posterior communicating artery. Both PCAs well perfused to their distal aspects without stenosis. No intracranial aneurysm. Anatomic variants: Dominant right vertebral artery. IMPRESSION: MRI HEAD IMPRESSION: 1. No acute intracranial abnormality. 2. 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 3. Underlying moderate chronic microvascular ischemic disease. MRA HEAD IMPRESSION: Normal intracranial MRA. No large vessel occlusion, hemodynamically significant stenosis, or other acute vascular abnormality. Electronically Signed   By: Rise Mu M.D.   On: 09/19/2020 04:00      Assessment/Plan Principal Problem:   Vertigo Active Problems:   PSVT (paroxysmal supraventricular tachycardia) (HCC)   Essential hypertension     Vertigo (POA) Probably BPV No response to Meclizine Trial of Benzodiazepines    Paroxysmal supraventricular tachycardia Continue metoprolol    Hypertension Continue metoprolol and lisinopril  DVT prophylaxis: Lovenox  Code Status: full code  Family Communication: Greater than 50% of time was spent discussing plan of care with patient at the bedside.  All questions and concerns have been addressed.  She verbalizes understanding and agrees with the plan. Disposition Plan: Back to previous home environment Consults called: Neurology Status: Observation    Hser Belanger MD [facility]     09/19/2020, 8:42 AM

## 2020-09-19 NOTE — ED Notes (Signed)
Pt going to mri 

## 2020-09-19 NOTE — ED Notes (Signed)
Assisted to room bathroom, pt c/o ongoing dizziness with movement, feels no better

## 2020-09-19 NOTE — ED Notes (Signed)
Report given to Ariel RN

## 2020-09-20 DIAGNOSIS — G935 Compression of brain: Secondary | ICD-10-CM | POA: Diagnosis not present

## 2020-09-20 DIAGNOSIS — H8110 Benign paroxysmal vertigo, unspecified ear: Secondary | ICD-10-CM | POA: Diagnosis not present

## 2020-09-20 DIAGNOSIS — I471 Supraventricular tachycardia: Secondary | ICD-10-CM | POA: Diagnosis not present

## 2020-09-20 DIAGNOSIS — R7301 Impaired fasting glucose: Secondary | ICD-10-CM

## 2020-09-20 DIAGNOSIS — I10 Essential (primary) hypertension: Secondary | ICD-10-CM | POA: Diagnosis not present

## 2020-09-20 LAB — BASIC METABOLIC PANEL
Anion gap: 5 (ref 5–15)
BUN: 12 mg/dL (ref 8–23)
CO2: 26 mmol/L (ref 22–32)
Calcium: 9.3 mg/dL (ref 8.9–10.3)
Chloride: 107 mmol/L (ref 98–111)
Creatinine, Ser: 0.65 mg/dL (ref 0.44–1.00)
GFR, Estimated: >60 mL/min (ref >60)
Glucose, Bld: 121 mg/dL — ABNORMAL HIGH (ref 70–99)
Potassium: 4.2 mmol/L (ref 3.5–5.1)
Sodium: 138 mmol/L (ref 135–145)

## 2020-09-20 LAB — CBC
HCT: 40.8 % (ref 36.0–46.0)
Hemoglobin: 13.1 g/dL (ref 12.0–15.0)
MCH: 22.7 pg — ABNORMAL LOW (ref 26.0–34.0)
MCHC: 32.1 g/dL (ref 30.0–36.0)
MCV: 70.8 fL — ABNORMAL LOW (ref 80.0–100.0)
Platelets: 331 10*3/uL (ref 150–400)
RBC: 5.76 MIL/uL — ABNORMAL HIGH (ref 3.87–5.11)
RDW: 16.4 % — ABNORMAL HIGH (ref 11.5–15.5)
WBC: 8.2 10*3/uL (ref 4.0–10.5)
nRBC: 0 % (ref 0.0–0.2)

## 2020-09-20 NOTE — TOC Progression Note (Signed)
Transition of Care [facility]) - Progression Note    Patient Details  Name: Jordan Barron MRN: [id number] Date of Birth: [birth year]  [facility] CM/SW Contact  Barrie Dunker, RN Phone Number: 09/20/2020, 9:35 AM  Clinical Narrative:    Spoke with the patient about DC plan and needs, She is agreeable to outpatient PT, I faxed the PT referral to [facility], She stated she does not feel that she needs any DME but if she does she will purchase on her own, Her daughter provides transportation, she can afford her medications        Expected Discharge Plan and Services           Expected Discharge Date: 09/20/20                                     Social Determinants of Health (SDOH) Interventions    Readmission Risk Interventions No flowsheet data found.

## 2020-09-20 NOTE — Progress Notes (Signed)
PT Cancellation Note  Patient Details Name: AIGNER HORSEMAN MRN: [id number] DOB: 12-03-1950   Cancelled Treatment:    Reason Eval/Treat Not Completed: Other (comment). Pt reported that she wanted to eat breakfast prior to attempting to ambulate. Pt aware PT may not be able to return this AM, but is comfortable walking with nursing staff.    Olga Coaster PT, DPT 10:01 AM,09/20/20

## 2020-09-20 NOTE — Discharge Summary (Signed)
[facility] [place] [facility]   PATIENT NAME: Jordan Barron    MR#:  [id number]  DATE OF BIRTH:  [birth year]  DATE OF ADMISSION:  09/18/2020 ADMITTING PHYSICIAN: Lucile Shutters, MD  DATE OF DISCHARGE: 09/20/2020 12:37 PM  PRIMARY CARE PHYSICIAN: Long, Scott, PA-C    ADMISSION DIAGNOSIS:  Vertigo [R42] Peripheral positional vertigo, unspecified laterality [H81.399]  DISCHARGE DIAGNOSIS:  Principal Problem:   Vertigo Active Problems:   PSVT (paroxysmal supraventricular tachycardia) (HCC)   Essential hypertension   SECONDARY DIAGNOSIS:   Past Medical History:  Diagnosis Date   Hypertension    Paroxysmal SVT (supraventricular tachycardia) (HCC)     HOSPITAL COURSE:   Benign positional vertigo.  Patient had an MRI of the brain and it was negative for stroke.  Patient was seen by neurology and given some instructions about the Epley maneuver.  Physical therapy recommended outpatient PT.  Vertigo has completely resolved at the time of discharge.  Tympanic membrane does not show any erythema. 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  Patient not having any symptoms with regards to this.  Follow-up with PMD as outpatient. Impaired fasting glucose.  Follow-up as outpatient  DISCHARGE CONDITIONS:   Satisfactory  CONSULTS OBTAINED:   Neurology  DRUG ALLERGIES:  No Known Allergies  DISCHARGE MEDICATIONS:   Allergies as of 09/20/2020   No Known Allergies      Medication List     TAKE these medications    ascorbic acid 500 MG tablet Commonly known as: VITAMIN C Take 500 mg by mouth daily.   aspirin EC 81 MG tablet Take 81 mg by mouth daily.   fluticasone 50 MCG/ACT nasal spray Commonly known as: FLONASE Place 2 sprays into both nostrils daily as needed for allergies.   lisinopril  30 MG tablet Commonly known as: ZESTRIL Take 30 mg by mouth daily.   Metoprolol Succinate 25 MG Cs24 Take 25 mg by mouth at bedtime.   metoprolol tartrate 25 MG tablet Commonly known as: LOPRESSOR Take 25 mg by mouth 2 (two) times daily as needed (heart rate >110).   multivitamin capsule Take 1 capsule by mouth daily.   pantoprazole 20 MG tablet Commonly known as: PROTONIX Take 20 mg by mouth 2 (two) times daily.         DISCHARGE INSTRUCTIONS:   Follow-up PMD 5 days  If you experience worsening of your admission symptoms, develop shortness of breath, life threatening emergency, suicidal or homicidal thoughts you must seek medical attention immediately by calling 911 or calling your MD immediately  if symptoms less severe.  You Must read complete instructions/literature along with all the possible adverse reactions/side effects for all the Medicines you take and that have been prescribed to you. Take any new Medicines after you have completely understood and accept all the possible adverse reactions/side effects.   Please note  You were cared for by a hospitalist during your hospital stay. If you have any questions about your discharge medications or the care you received while you were in the hospital after you are discharged, you can call the unit and asked to speak with the hospitalist on call if the hospitalist that took care of you is not available. Once you are discharged, your primary care physician will handle any further medical issues. Please note that NO REFILLS for any discharge medications will be authorized once  you are discharged, as it is imperative that you return to your primary care physician (or establish a relationship with a primary care physician if you do not have one) for your aftercare needs so that they can reassess your need for medications and monitor your lab values.    Today   CHIEF COMPLAINT:   Chief Complaint  Patient presents with    Palpitations   Dizziness    HISTORY OF PRESENT ILLNESS:  Jordan Barron  is a 70 y.o. female came in with dizziness and palpitations   VITAL SIGNS:  Blood pressure (!) 157/58, pulse 60, temperature 98.2 F (36.8 C), temperature source Oral, resp. rate 18, height 5\' 10"  (1.778 m), weight 97.5 kg, SpO2 97 %.  I/O:  No intake or output data in the 24 hours ending 09/20/20 1757  PHYSICAL EXAMINATION:  GENERAL:  70 y.o.-year-old patient lying in the bed with no acute distress.  EYES: Pupils equal, round, reactive to light and accommodation. No scleral icterus. Extraocular muscles intact.  HEENT: Head atraumatic, normocephalic. Oropharynx and nasopharynx clear.  Tympanic membrane no erythema bilaterally LUNGS: Normal breath sounds bilaterally, no wheezing, rales,rhonchi or crepitation. No use of accessory muscles of respiration.  CARDIOVASCULAR: S1, S2 normal. No murmurs, rubs, or gallops.  ABDOMEN: Soft, non-tender, non-distended.  EXTREMITIES: No pedal edema.  NEUROLOGIC: Cranial nerves II through XII are intact. Muscle strength 5/5 in all extremities. Sensation intact. Gait not checked.  PSYCHIATRIC: The patient is alert and oriented x 3.  SKIN: No obvious rash, lesion, or ulcer.   DATA REVIEW:   CBC Recent Labs  Lab 09/20/20 0559  WBC 8.2  HGB 13.1  HCT 40.8  PLT 331    Chemistries  Recent Labs  Lab 09/19/20 1505 09/20/20 0559  NA  --  138  K  --  4.2  CL  --  107  CO2  --  26  GLUCOSE  --  121*  BUN  --  12  CREATININE  --  0.65  CALCIUM  --  9.3  MG 2.1  --     Microbiology Results  Results for orders placed or performed during the hospital encounter of 09/18/20  Resp Panel by RT-PCR (Flu A&B, Covid) Nasopharyngeal Swab     Status: None   Collection Time: 09/19/20  5:42 AM   Specimen: Nasopharyngeal Swab; Nasopharyngeal(NP) swabs in vial transport medium  Result Value Ref Range Status   SARS Coronavirus 2 by RT PCR NEGATIVE NEGATIVE Final    Comment:  (NOTE) SARS-CoV-2 target nucleic acids are NOT DETECTED.  The SARS-CoV-2 RNA is generally detectable in upper respiratory specimens during the acute phase of infection. The lowest concentration of SARS-CoV-2 viral copies this assay can detect is 138 copies/mL. A negative result does not preclude SARS-Cov-2 infection and should not be used as the sole basis for treatment or other patient management decisions. A negative result may occur with  improper specimen collection/handling, submission of specimen other than nasopharyngeal swab, presence of viral mutation(s) within the areas targeted by this assay, and inadequate number of viral copies(<138 copies/mL). A negative result must be combined with clinical observations, patient history, and epidemiological information. The expected result is Negative.  Fact Sheet for Patients:  11/19/20  Fact Sheet for Healthcare Providers:  BloggerCourse.com  This test is no t yet approved or cleared by the [place] FDA and  has been authorized for detection and/or diagnosis of SARS-CoV-2 by FDA under an Emergency Use Authorization (EUA). This EUA will  remain  in effect (meaning this test can be used) for the duration of the COVID-19 declaration under Section 564(b)(1) of the Act, 21 U.S.C.section 360bbb-3(b)(1), unless the authorization is terminated  or revoked sooner.       Influenza A by PCR NEGATIVE NEGATIVE Final   Influenza B by PCR NEGATIVE NEGATIVE Final    Comment: (NOTE) The Xpert Xpress SARS-CoV-2/FLU/RSV plus assay is intended as an aid in the diagnosis of influenza from Nasopharyngeal swab specimens and should not be used as a sole basis for treatment. Nasal washings and aspirates are unacceptable for Xpert Xpress SARS-CoV-2/FLU/RSV testing.  Fact Sheet for Patients: BloggerCourse.comhttps://www.fda.gov/media/152166/download  Fact Sheet for Healthcare  Providers: SeriousBroker.ithttps://www.fda.gov/media/152162/download  This test is not yet approved or cleared by the [place] FDA and has been authorized for detection and/or diagnosis of SARS-CoV-2 by FDA under an Emergency Use Authorization (EUA). This EUA will remain in effect (meaning this test can be used) for the duration of the COVID-19 declaration under Section 564(b)(1) of the Act, 21 U.S.C. section 360bbb-3(b)(1), unless the authorization is terminated or revoked.  Performed at [facility], [address] [place], [place] [address]     RADIOLOGY:  MR ANGIO HEAD WO CONTRAST  Result Date: 09/19/2020 CLINICAL DATA:  Initial evaluation for acute ataxia, stroke suspected. EXAM: MRI HEAD WITHOUT CONTRAST MRA HEAD WITHOUT CONTRAST TECHNIQUE: Multiplanar, multi-echo pulse sequences of the brain and surrounding structures were acquired without intravenous contrast. Angiographic images of the Circle of Willis were acquired using MRA technique without intravenous contrast. COMPARISON: No pertinent prior exam. COMPARISON:  Prior head CT from 05/10/2015. FINDINGS: MRI HEAD FINDINGS Brain: Cerebral volume within normal limits for age. Scattered patchy T2/FLAIR hyperintensity within the periventricular deep white matter both cerebral hemispheres, most likely related chronic microvascular ischemic disease, moderate in nature. No abnormal foci of restricted diffusion to suggest acute or subacute ischemia. Gray-white matter differentiation maintained. No encephalomalacia to suggest chronic cortical infarction. No foci of susceptibility artifact to suggest acute or chronic intracranial hemorrhage. No mass lesion, midline shift or mass effect. No hydrocephalus. Absence of the septum pellucidum noted. No extra-axial fluid collection. Pituitary gland suprasellar region normal. Midline structures intact. Vascular: Major intracranial vascular flow voids are within normal limits. Skull and upper cervical  spine: Cerebellar tonsils are beaked and extend up to 1 cm below the foramen magnum, consistent with Chiari 1 malformation. No visible syrinx within the upper cervical spinal cord. Bone marrow signal intensity within normal limits. No scalp soft tissue abnormality. Sinuses/Orbits: Globes and orbital soft tissues within normal limits. Paranasal sinuses are largely clear. No mastoid effusion. Inner ear structures grossly normal. Other: None. MRA HEAD FINDINGS Anterior circulation: Examination mildly degraded by motion artifact. Visualized distal cervical segments of the internal carotid arteries are patent with symmetric antegrade flow. Petrous, cavernous, and supraclinoid segments widely patent without stenosis or other abnormality. A1 segments patent bilaterally. Normal anterior communicating artery complex. Anterior cerebral arteries patent to their distal aspects without stenosis. No M1 stenosis or occlusion. Normal MCA bifurcations. Distal MCA branches well perfused and symmetric. Posterior circulation: Both vertebral arteries patent to the vertebrobasilar junction without stenosis. Right vertebral artery dominant. Neither PICA origin well visualized. Basilar patent to its distal aspect without stenosis. Superior cerebellar arteries patent bilaterally. Left PCA supplied via the basilar. Right PCA supplied via the basilar as well as a prominent right posterior communicating artery. Both PCAs well perfused to their distal aspects without stenosis. No intracranial aneurysm. Anatomic variants: Dominant right vertebral artery.  IMPRESSION: MRI HEAD IMPRESSION: 1. No acute intracranial abnormality. 2. 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 3. Underlying moderate chronic microvascular ischemic disease. MRA HEAD IMPRESSION: Normal intracranial MRA. No large vessel occlusion, hemodynamically significant stenosis, or other acute vascular abnormality. Electronically  Signed   By: Rise Mu M.D.   On: 09/19/2020 04:00   MR BRAIN WO CONTRAST  Result Date: 09/19/2020 CLINICAL DATA:  Initial evaluation for acute ataxia, stroke suspected. EXAM: MRI HEAD WITHOUT CONTRAST MRA HEAD WITHOUT CONTRAST TECHNIQUE: Multiplanar, multi-echo pulse sequences of the brain and surrounding structures were acquired without intravenous contrast. Angiographic images of the Circle of Willis were acquired using MRA technique without intravenous contrast. COMPARISON: No pertinent prior exam. COMPARISON:  Prior head CT from 05/10/2015. FINDINGS: MRI HEAD FINDINGS Brain: Cerebral volume within normal limits for age. Scattered patchy T2/FLAIR hyperintensity within the periventricular deep white matter both cerebral hemispheres, most likely related chronic microvascular ischemic disease, moderate in nature. No abnormal foci of restricted diffusion to suggest acute or subacute ischemia. Gray-white matter differentiation maintained. No encephalomalacia to suggest chronic cortical infarction. No foci of susceptibility artifact to suggest acute or chronic intracranial hemorrhage. No mass lesion, midline shift or mass effect. No hydrocephalus. Absence of the septum pellucidum noted. No extra-axial fluid collection. Pituitary gland suprasellar region normal. Midline structures intact. Vascular: Major intracranial vascular flow voids are within normal limits. Skull and upper cervical spine: Cerebellar tonsils are beaked and extend up to 1 cm below the foramen magnum, consistent with Chiari 1 malformation. No visible syrinx within the upper cervical spinal cord. Bone marrow signal intensity within normal limits. No scalp soft tissue abnormality. Sinuses/Orbits: Globes and orbital soft tissues within normal limits. Paranasal sinuses are largely clear. No mastoid effusion. Inner ear structures grossly normal. Other: None. MRA HEAD FINDINGS Anterior circulation: Examination mildly degraded by motion  artifact. Visualized distal cervical segments of the internal carotid arteries are patent with symmetric antegrade flow. Petrous, cavernous, and supraclinoid segments widely patent without stenosis or other abnormality. A1 segments patent bilaterally. Normal anterior communicating artery complex. Anterior cerebral arteries patent to their distal aspects without stenosis. No M1 stenosis or occlusion. Normal MCA bifurcations. Distal MCA branches well perfused and symmetric. Posterior circulation: Both vertebral arteries patent to the vertebrobasilar junction without stenosis. Right vertebral artery dominant. Neither PICA origin well visualized. Basilar patent to its distal aspect without stenosis. Superior cerebellar arteries patent bilaterally. Left PCA supplied via the basilar. Right PCA supplied via the basilar as well as a prominent right posterior communicating artery. Both PCAs well perfused to their distal aspects without stenosis. No intracranial aneurysm. Anatomic variants: Dominant right vertebral artery. IMPRESSION: MRI HEAD IMPRESSION: 1. No acute intracranial abnormality. 2. 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 3. Underlying moderate chronic microvascular ischemic disease. MRA HEAD IMPRESSION: Normal intracranial MRA. No large vessel occlusion, hemodynamically significant stenosis, or other acute vascular abnormality. Electronically Signed   By: Rise Mu M.D.   On: 09/19/2020 04:00     Management plans discussed with the patient, and she is in agreement.  Patient deferred me calling family at the time of discharge.  CODE STATUS:  Code Status History     Date Active Date Inactive Code Status Order ID Comments User Context   09/19/2020 0847 09/20/2020 1737 Full Code [id number]  Lucile Shutters, MD ED      Questions for Most Recent Historical Code  Status (Order [id number])        TOTAL TIME TAKING CARE OF THIS PATIENT: 35  minutes.    Alford Highland M.D on 09/20/2020 at 5:57 PM  [facility]  CC: Primary care physician; Lindaann Pascal, PA-C

## 2020-09-20 NOTE — Progress Notes (Signed)
Discharge instructions reviewed with the pt , see avs. Including medication, f/u appointments and activity. IV dc'd tip intact. Pt awaiting daughter for pick up. All questions answered.

## 2020-09-20 NOTE — Plan of Care (Signed)
  Problem: Health Behavior/Discharge Planning: Goal: Ability to manage health-related needs will improve Outcome: Progressing   

## 2021-01-23 ENCOUNTER — Emergency Department: Payer: Medicare Other

## 2021-01-23 ENCOUNTER — Other Ambulatory Visit: Payer: Self-pay

## 2021-01-23 DIAGNOSIS — F419 Anxiety disorder, unspecified: Secondary | ICD-10-CM | POA: Diagnosis present

## 2021-01-23 DIAGNOSIS — R0602 Shortness of breath: Secondary | ICD-10-CM | POA: Insufficient documentation

## 2021-01-23 DIAGNOSIS — R109 Unspecified abdominal pain: Secondary | ICD-10-CM | POA: Insufficient documentation

## 2021-01-23 DIAGNOSIS — R002 Palpitations: Secondary | ICD-10-CM | POA: Diagnosis not present

## 2021-01-23 DIAGNOSIS — Z79899 Other long term (current) drug therapy: Secondary | ICD-10-CM | POA: Diagnosis not present

## 2021-01-23 DIAGNOSIS — I444 Left anterior fascicular block: Secondary | ICD-10-CM | POA: Insufficient documentation

## 2021-01-23 DIAGNOSIS — I10 Essential (primary) hypertension: Secondary | ICD-10-CM | POA: Insufficient documentation

## 2021-01-23 DIAGNOSIS — Z7982 Long term (current) use of aspirin: Secondary | ICD-10-CM | POA: Insufficient documentation

## 2021-01-23 DIAGNOSIS — R079 Chest pain, unspecified: Secondary | ICD-10-CM | POA: Insufficient documentation

## 2021-01-23 LAB — CBC
HCT: 42.7 % (ref 36.0–46.0)
Hemoglobin: 13.8 g/dL (ref 12.0–15.0)
MCH: 23.5 pg — ABNORMAL LOW (ref 26.0–34.0)
MCHC: 32.3 g/dL (ref 30.0–36.0)
MCV: 72.9 fL — ABNORMAL LOW (ref 80.0–100.0)
Platelets: 325 10*3/uL (ref 150–400)
RBC: 5.86 MIL/uL — ABNORMAL HIGH (ref 3.87–5.11)
RDW: 15.7 % — ABNORMAL HIGH (ref 11.5–15.5)
WBC: 9.9 10*3/uL (ref 4.0–10.5)
nRBC: 0 % (ref 0.0–0.2)

## 2021-01-23 LAB — BASIC METABOLIC PANEL
Anion gap: 9 (ref 5–15)
BUN: 18 mg/dL (ref 8–23)
CO2: 25 mmol/L (ref 22–32)
Calcium: 9.6 mg/dL (ref 8.9–10.3)
Chloride: 102 mmol/L (ref 98–111)
Creatinine, Ser: 0.72 mg/dL (ref 0.44–1.00)
GFR, Estimated: >60 mL/min (ref >60)
Glucose, Bld: 121 mg/dL — ABNORMAL HIGH (ref 70–99)
Potassium: 3.9 mmol/L (ref 3.5–5.1)
Sodium: 136 mmol/L (ref 135–145)

## 2021-01-23 LAB — LIPASE, BLOOD: Lipase: 36 U/L (ref 11–51)

## 2021-01-23 LAB — TSH: TSH: 1.858 u[IU]/mL (ref 0.350–4.500)

## 2021-01-23 LAB — TROPONIN I (HIGH SENSITIVITY)
Troponin I (High Sensitivity): <2 ng/L (ref <18)
Troponin I (High Sensitivity): 2 ng/L (ref <18)

## 2021-01-23 LAB — MAGNESIUM: Magnesium: 2.2 mg/dL (ref 1.7–2.4)

## 2021-01-23 IMAGING — CR DG CHEST 2V
2 series · 2 of 2 positions shown · non-contrast
Comparison: [DATE]

CLINICAL DATA: Shortness of breath

EXAM:
CHEST - 2 VIEW

[chest pa]
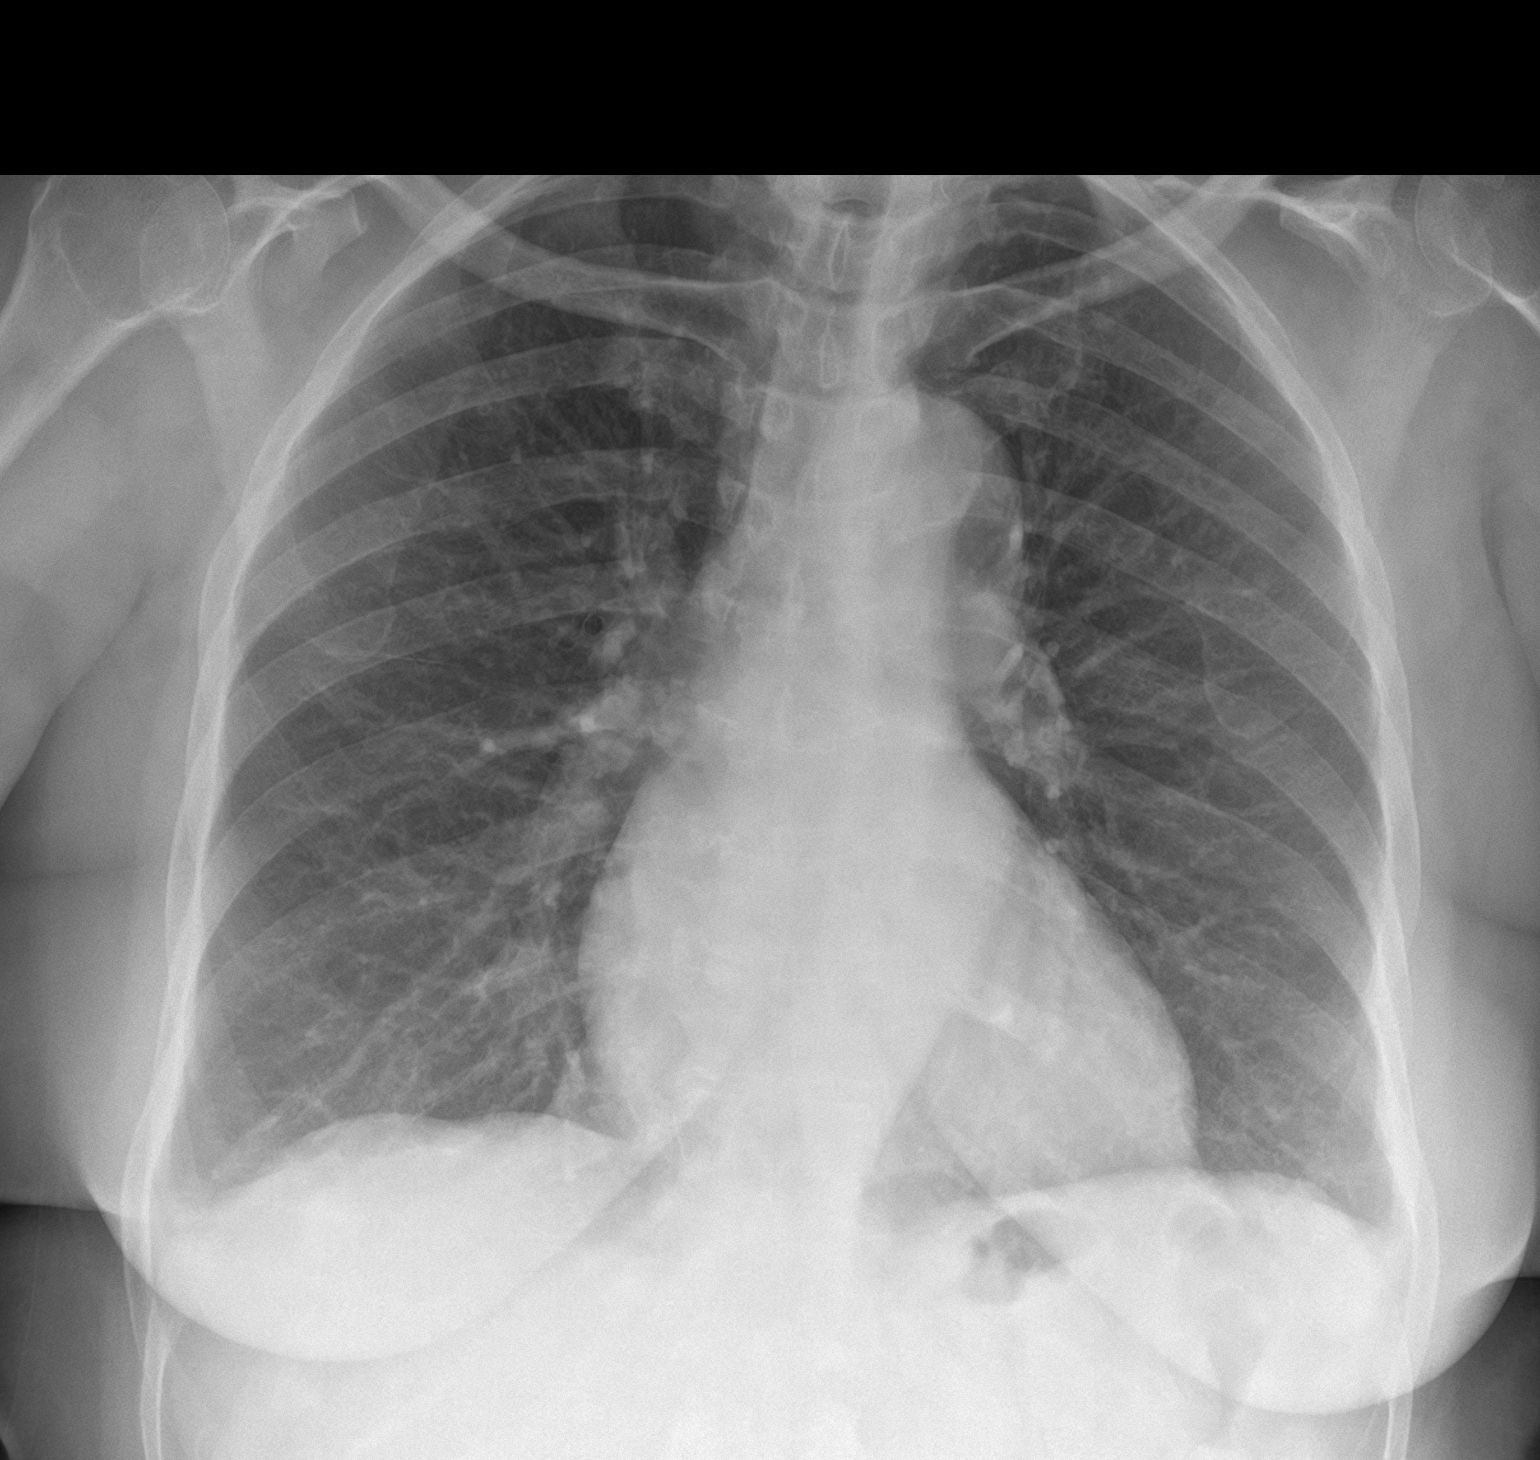

[chest lat]
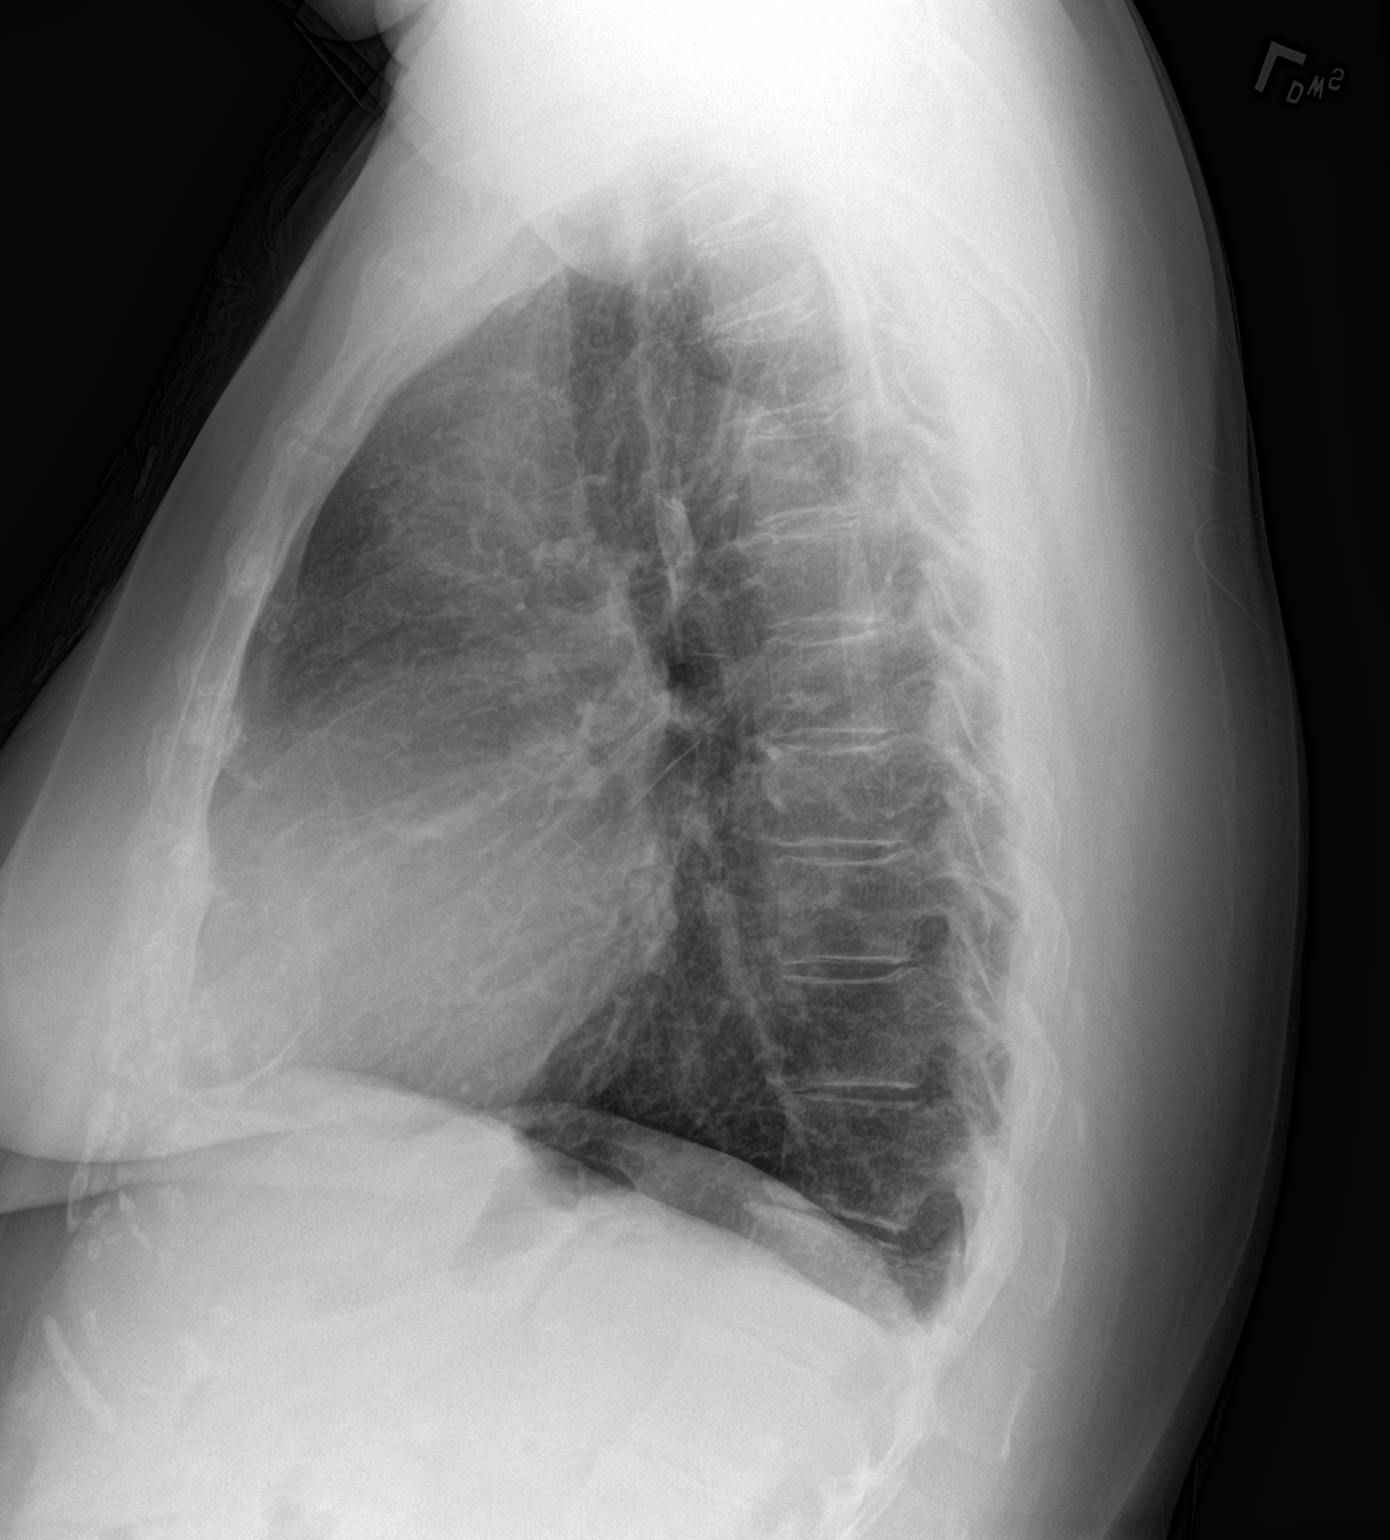

[2 of 2 positions shown; findings below may reference images not displayed]

FINDINGS: The heart size and mediastinal contours are within normal limits.
Both lungs are clear. The visualized skeletal structures are
unremarkable.
IMPRESSION: No active cardiopulmonary disease.

## 2021-01-23 NOTE — ED Triage Notes (Signed)
Pt comes pov with upper abd pain, lightheadedness, chest pain, SOB for about 2 days. States today it is worse.

## 2021-01-24 ENCOUNTER — Emergency Department
Admission: EM | Admit: 2021-01-24 | Discharge: 2021-01-24 | Disposition: A | Discharge status: Home / Self Care | Payer: Medicare Other | Attending: Emergency Medicine | Admitting: Emergency Medicine

## 2021-01-24 ENCOUNTER — Emergency Department: Payer: Medicare Other

## 2021-01-24 ENCOUNTER — Encounter: Payer: Self-pay | Admitting: Radiology

## 2021-01-24 DIAGNOSIS — I10 Essential (primary) hypertension: Secondary | ICD-10-CM

## 2021-01-24 DIAGNOSIS — F419 Anxiety disorder, unspecified: Secondary | ICD-10-CM

## 2021-01-24 LAB — HEPATIC FUNCTION PANEL
ALT: 18 U/L (ref 0–44)
AST: 17 U/L (ref 15–41)
Albumin: 4 g/dL (ref 3.5–5.0)
Alkaline Phosphatase: 103 U/L (ref 38–126)
Bilirubin, Direct: <0.1 mg/dL (ref 0.0–0.2)
Total Bilirubin: 0.6 mg/dL (ref 0.3–1.2)
Total Protein: 8.7 g/dL — ABNORMAL HIGH (ref 6.5–8.1)

## 2021-01-24 IMAGING — CR DG ABDOMEN ACUTE W/ 1V CHEST
1 series · 5 of 5 positions shown · non-contrast
Comparison: None.

CLINICAL DATA: Abdominal pressure/distension

EXAM:
DG ABDOMEN ACUTE WITH 1 VIEW CHEST

[Series 1: dg abd acute 2+v w 1v chest · 0.14mm/px · 5 of 5 slices shown]
[im 1/5]
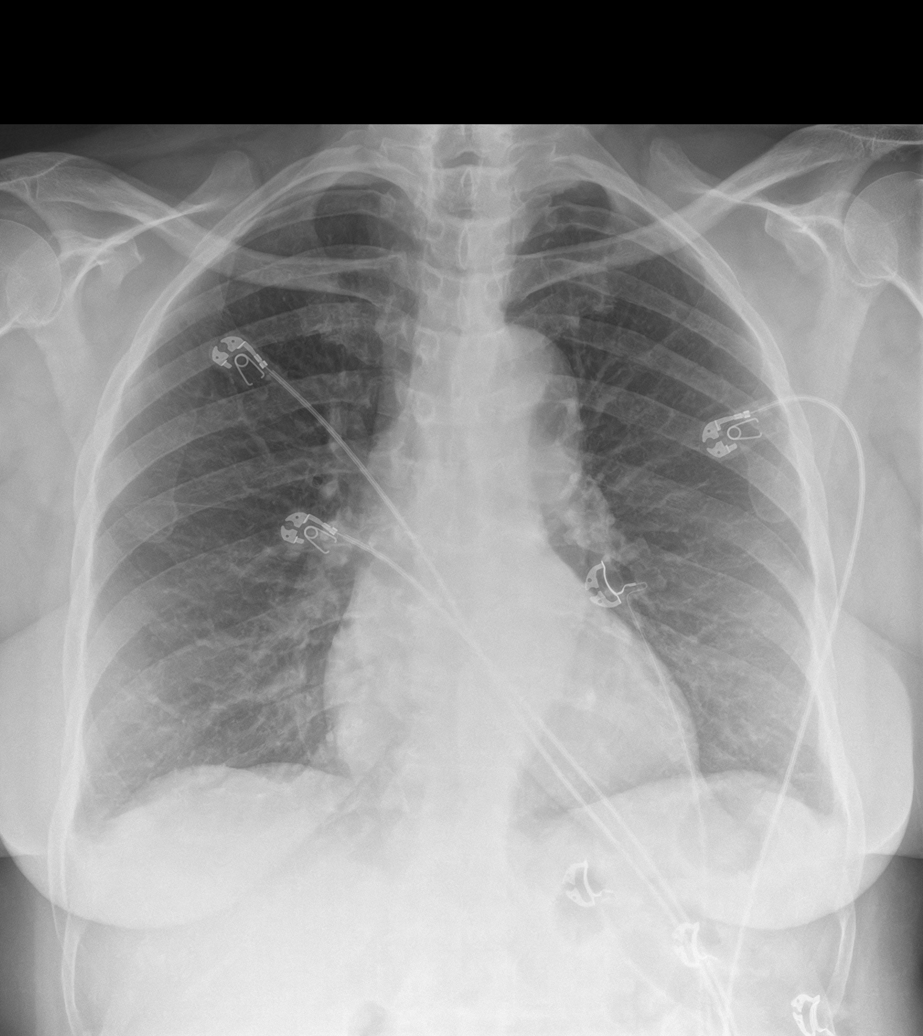
[im 2/5]
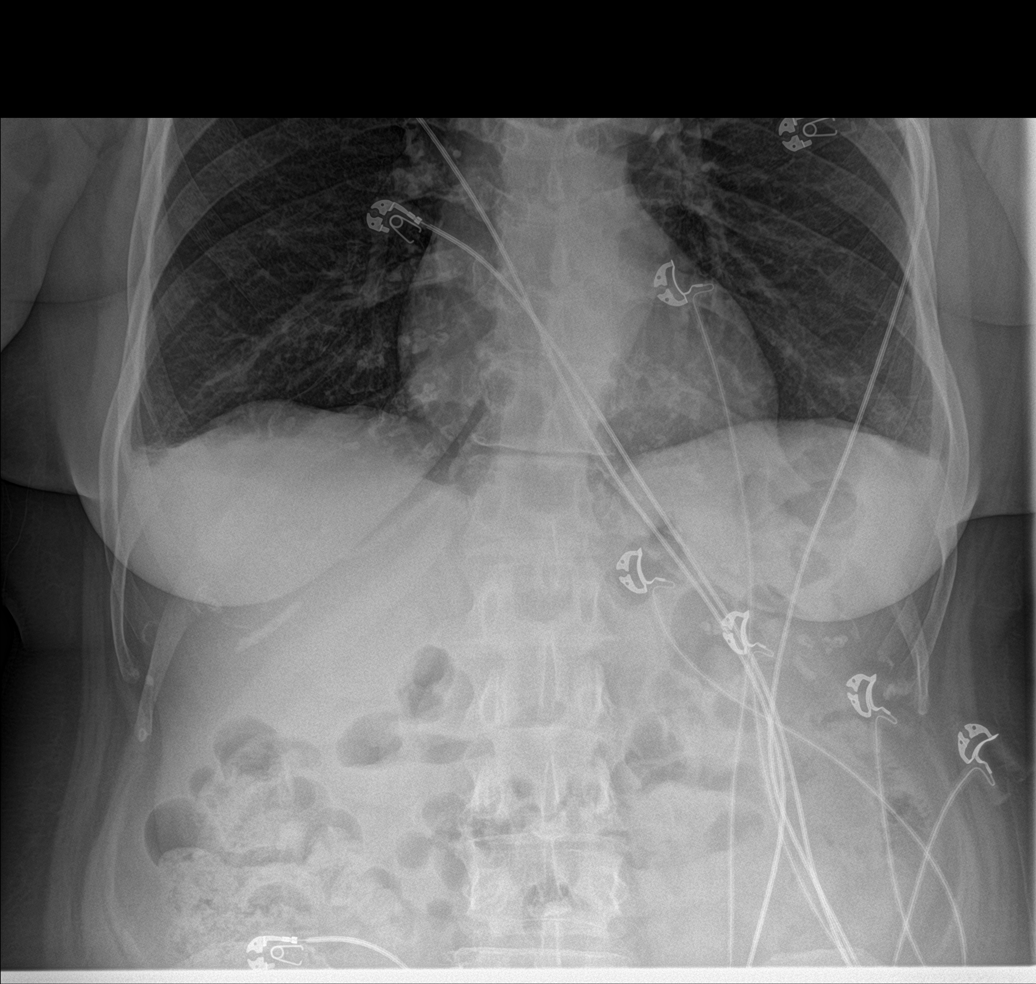
[im 3/5]
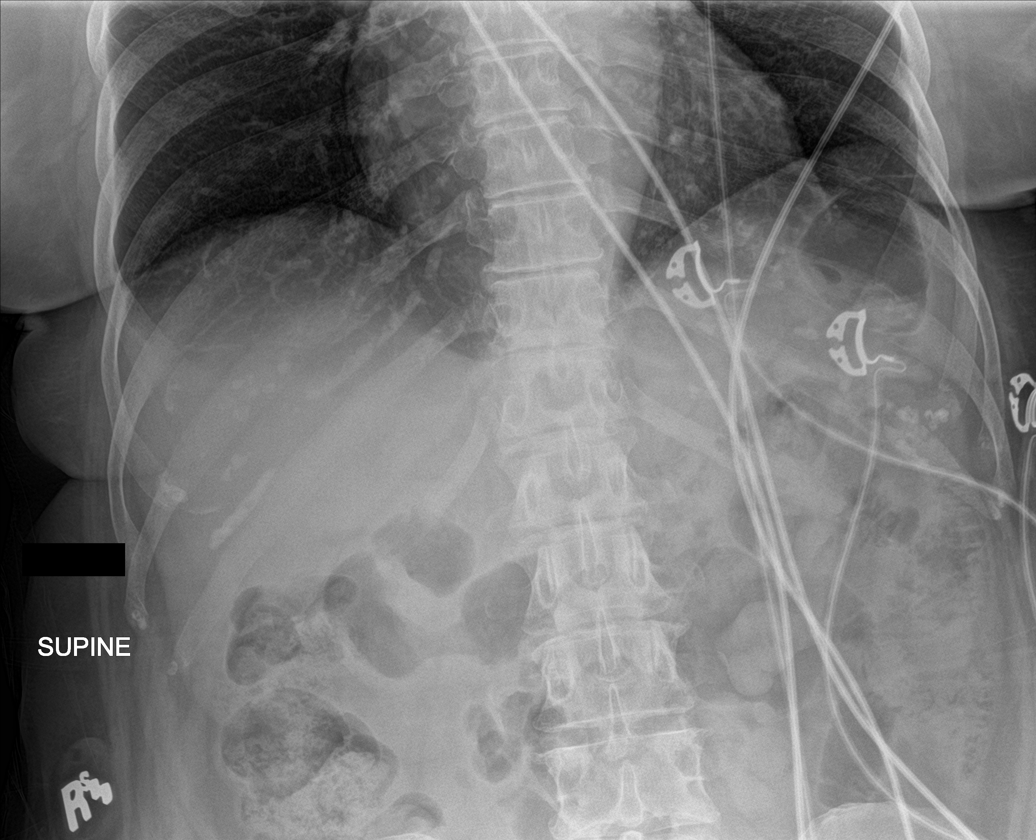
[im 4/5]
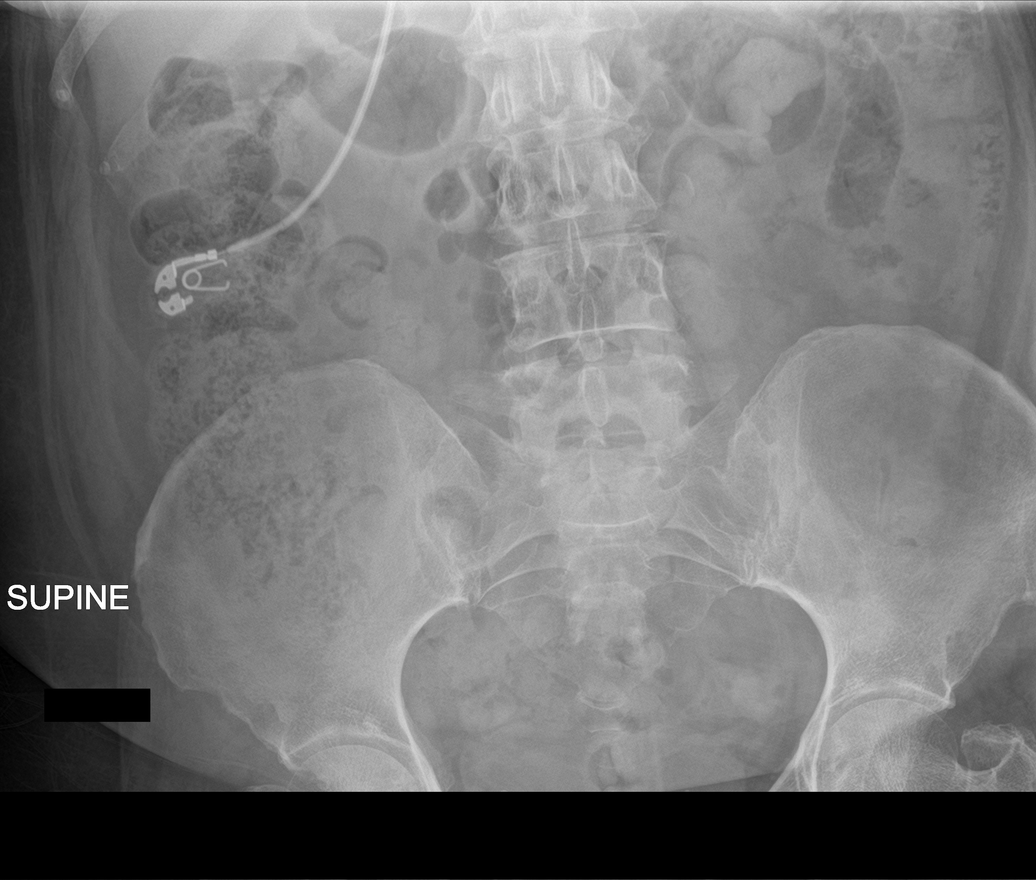
[im 5/5]
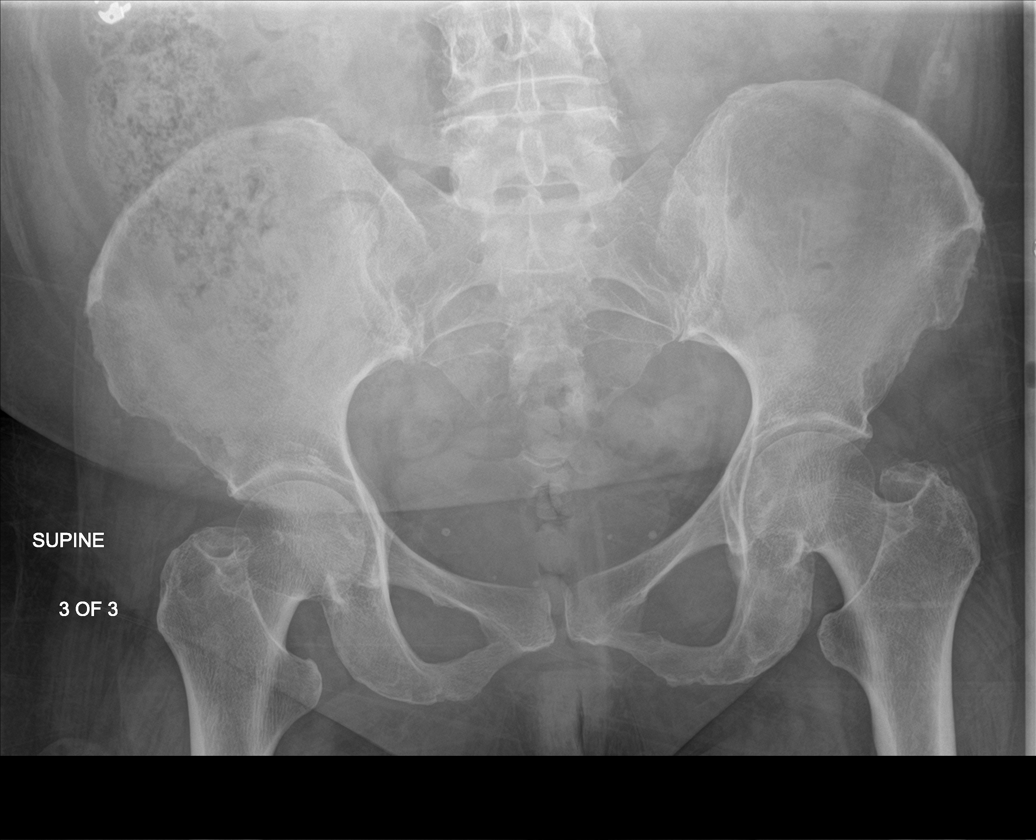

[5 of 5 positions shown; findings below may reference images not displayed]

FINDINGS: Lungs are clear.  No pleural effusion or pneumothorax.

The heart is normal in size.

Nonobstructive bowel gas pattern.

No evidence of free air under the diaphragm on the upright view.

Visualized osseous structures are within normal limits.
IMPRESSION: Negative abdominal radiographs.  No acute cardiopulmonary disease.

## 2021-01-24 MED ORDER — METOPROLOL SUCCINATE ER 50 MG PO TB24
25.0000 mg | ORAL_TABLET | Freq: Every day | ORAL | Status: DC
  Administered 2021-01-24: 25 mg via ORAL
  Filled 2021-01-24: qty 1

## 2021-01-24 MED ORDER — METOPROLOL TARTRATE 25 MG PO TABS
25.0000 mg | ORAL_TABLET | Freq: Once | ORAL | Status: DC
  Filled 2021-01-24: qty 1

## 2021-01-24 MED ORDER — METOPROLOL SUCCINATE ER 50 MG PO TB24: 25.0000 mg | ORAL_TABLET | Freq: Every day | ORAL | Status: DC

## 2021-01-24 NOTE — ED Provider Notes (Addendum)
[facility] Emergency Department Provider Note  ____________________________________________   Event Date/Time   First MD Initiated Contact with Patient 01/24/21 0059     (approximate)  I have reviewed the triage vital signs and the nursing notes.   HISTORY  Chief Complaint Abdominal Pain, Chest Pain, and Shortness of Breath    HPI Jordan Barron is a 70 y.o. female with medical history as listed below who presents for evaluation of several days of pressure in her upper abdomen.  She says it is not painful, but it feels like someone is sitting on her upper abdomen.  She denies any chest pain or discomfort.  She has had no nausea nor vomiting.  Nothing in particular seems to make it better or worse including eating.  She reports that she has also had some dizziness recently and occasional palpitations.  She has a history of vertigo and the dizziness feels a little bit similar.   She has a history of anxiety and has been feeling more anxious than usual recently.  She says she is going through some financial issues and has not been able to pay some of her bills nor find a job so she said this has been leading to a great deal of stress.  She thinks that might be contributing to what is going on with her.  Overall she reports that her symptoms are moderate but she wanted to be checked out to make sure they were okay.  Nothing in particular makes it better or worse.     Past Medical History:  Diagnosis Date  . Hypertension   . Paroxysmal SVT (supraventricular tachycardia) [facility])     Patient Active Problem List   Diagnosis Date Noted  . Benign paroxysmal positional vertigo   . Chiari malformation type I (HCC)   . Impaired fasting glucose   . Vertigo 09/19/2020  . PSVT (paroxysmal supraventricular tachycardia) (HCC) 09/10/2014  . Essential hypertension 09/10/2014    Past Surgical History:  Procedure Laterality Date  . ABDOMINAL HYSTERECTOMY       Prior to Admission medications   Medication Sig Start Date End Date Taking? Authorizing Provider  ascorbic acid (VITAMIN C) 500 MG tablet Take 500 mg by mouth daily.    [provider]  aspirin EC 81 MG tablet Take 81 mg by mouth daily.    [provider]  fluticasone (FLONASE) 50 MCG/ACT nasal spray Place 2 sprays into both nostrils daily as needed for allergies.    [provider]  lisinopril (ZESTRIL) 30 MG tablet Take 30 mg by mouth daily.    [provider]  Metoprolol Succinate 25 MG CS24 Take 25 mg by mouth at bedtime.    [provider]  metoprolol tartrate (LOPRESSOR) 25 MG tablet Take 25 mg by mouth 2 (two) times daily as needed (heart rate >110).    [provider]  Multiple Vitamin (MULTIVITAMIN) capsule Take 1 capsule by mouth daily.    [provider]  pantoprazole (PROTONIX) 20 MG tablet Take 20 mg by mouth 2 (two) times daily.    [provider]    Allergies Patient has no known allergies.  Family History  Problem Relation Age of Onset  . Other Mother        pacemaker    Social History Social History   Tobacco Use  . Smoking status: Never  . Smokeless tobacco: Never  Substance Use Topics  . Alcohol use: No  . Drug use: No  Review of Systems Constitutional: No fever/chills Eyes: No visual changes. ENT: No sore throat. Cardiovascular: Denies chest pain. Respiratory: Denies shortness of breath. Gastrointestinal: Pressure on her upper abdomen, but no pain.  No vomiting or diarrhea. Genitourinary: Negative for dysuria. Musculoskeletal: Negative for neck pain.  Negative for back pain. Integumentary: Negative for rash. Neurological: Negative for headaches, focal weakness or numbness. Psych: Positive for anxiety.  ____________________________________________   PHYSICAL EXAM:  VITAL SIGNS: ED Triage Vitals  Enc Vitals Group     BP 01/23/21 1753 (!) 175/110     Pulse Rate  01/23/21 1753 70     Resp 01/23/21 1753 20     Temp 01/23/21 1753 98.3 F (36.8 C)     Temp Source 01/23/21 1753 Oral     SpO2 01/23/21 1753 100 %     Weight 01/23/21 1754 97.1 kg (214 lb)     Height 01/23/21 1754 1.778 m (5\' 10" )     Head Circumference --      Peak Flow --      Pain Score 01/23/21 1753 8     Pain Loc --      Pain Edu? --      Excl. in GC? --     Constitutional: Alert and oriented.  Eyes: Conjunctivae are normal.  Head: Atraumatic. Nose: No congestion/rhinnorhea. Mouth/Throat: Patient is wearing a mask. Neck: No stridor.  No meningeal signs.   Cardiovascular: Normal rate, regular rhythm. Good peripheral circulation. Respiratory: Normal respiratory effort.  No retractions. Gastrointestinal: Soft and nontender. No distention.  Musculoskeletal: No lower extremity tenderness nor edema. No gross deformities of extremities. Neurologic:  Normal speech and language. No gross focal neurologic deficits are appreciated.  Skin:  Skin is warm, dry and intact. Psychiatric: Mood and affect are anxious but without emergent signs.  ____________________________________________   LABS (all labs ordered are listed, but only abnormal results are displayed)  Labs Reviewed  BASIC METABOLIC PANEL - Abnormal; Notable for the following components:      Result Value   Glucose, Bld 121 (*)    All other components within normal limits  CBC - Abnormal; Notable for the following components:   RBC 5.86 (*)    MCV 72.9 (*)    MCH 23.5 (*)    RDW 15.7 (*)    All other components within normal limits  HEPATIC FUNCTION PANEL - Abnormal; Notable for the following components:   Total Protein 8.7 (*)    All other components within normal limits  LIPASE, BLOOD  MAGNESIUM  TSH  TROPONIN I (HIGH SENSITIVITY)  TROPONIN I (HIGH SENSITIVITY)   ____________________________________________  EKG  ED ECG REPORT I, 01/25/21, the attending physician, personally viewed and interpreted  this ECG.  Date: 01/23/2021 EKG Time: 17: 58 Rate: 71 Rhythm: Sinus rhythm with frequent PVCs QRS Axis: normal Intervals: Left anterior fascicular block, LVH ST/T Wave abnormalities: Non-specific ST segment / T-wave changes, but no clear evidence of acute ischemia. Narrative Interpretation: no definitive evidence of acute ischemia; does not meet STEMI criteria.  Compared to old EKG, morphology is similar except with frequent PVCs  ____________________________________________  RADIOLOGY I, 01/25/2021, personally viewed and evaluated these images (plain radiographs) as part of my medical decision making, as well as reviewing the written report by the radiologist.  ED MD interpretation: No acute abnormalities identified on chest x-ray nor acute abdomen series.  Official radiology report(s): DG Chest 2 View  Result Date: 01/23/2021 CLINICAL DATA:  Shortness of breath EXAM: CHEST -  2 VIEW COMPARISON:  12/20/2013 FINDINGS: The heart size and mediastinal contours are within normal limits. Both lungs are clear. The visualized skeletal structures are unremarkable. IMPRESSION: No active cardiopulmonary disease. Electronically Signed   By: Charlett Nose M.D.   On: 01/23/2021 18:46   DG Abdomen Acute W/Chest  Result Date: 01/24/2021 CLINICAL DATA:  Abdominal pressure/distension EXAM: DG ABDOMEN ACUTE WITH 1 VIEW CHEST COMPARISON:  None. FINDINGS: Lungs are clear.  No pleural effusion or pneumothorax. The heart is normal in size. Nonobstructive bowel gas pattern. No evidence of free air under the diaphragm on the upright view. Visualized osseous structures are within normal limits. IMPRESSION: Negative abdominal radiographs.  No acute cardiopulmonary disease. Electronically Signed   By: Charline Bills M.D.   On: 01/24/2021 02:00    ____________________________________________   PROCEDURES   Procedure(s) performed (including Critical Care):  .1-3 Lead EKG Interpretation Performed by:  Loleta Rose, MD Authorized by: Loleta Rose, MD     Interpretation: normal     ECG rate:  70   ECG rate assessment: normal     Rhythm: sinus rhythm     Ectopy: PVCs     Conduction: normal     ____________________________________________   INITIAL IMPRESSION / MDM / ASSESSMENT AND PLAN / ED COURSE  As part of my medical decision making, I reviewed the following data within the electronic MEDICAL RECORD NUMBER Nursing notes reviewed and incorporated, Labs reviewed , EKG interpreted , Old EKG reviewed, Radiograph reviewed , and Notes from prior ED visits   Differential diagnosis includes, but is not limited to, anxiety, essential hypertension, SBO/ileus, biliary colic, pancreatitis, atypical ACS presentation.  The patient is on the cardiac monitor to evaluate for evidence of arrhythmia and/or significant heart rate changes.  Vital signs are notable for hypertension, otherwise unremarkable.  EKG shows PVCs which were not present on prior EKG but without evidence of ischemia.  High-sensitivity troponin is normal x2.  Thyroid function test normal.  Magnesium normal.  Basic metabolic panel is within normal limits.  Hepatic function test is normal.  CBC is normal.  There is no evidence patient is suffering from a hypertensive emergency.  I believe that anxiety is playing a role.  We will monitor the patient and likely provide her home evening medication (metoprolol) but at the moment it does not appear to be that there is evidence of an emergent medical condition.  I will reassess shortly and obtain acute abdomen series to make sure there is no evidence of obstruction or ileus, but the patient has no tenderness to palpation of the abdomen which is very reassuring.     Clinical Course as of 01/24/21 0421  Mon Jan 24, 2021  0350 Evaluation has been reassuring.  Acute abdomen series is negative; I personally reviewed the patient's imaging and agree with the radiologist's interpretation that there  are no acute abnormalities identified on the chest x-rays nor abdominal x-rays.  I went back and spoke with patient again.  She said that she thinks her anxiety is contributing and I very much agree.  Her blood pressure is elevated but not outrageously so and she stated that this always happens because she has "whitecoat hypertension".  She has also not taken her evening medication.  I offered to give both a dose of metoprolol succinate 25 mg and metoprolol tartrate 25 mg which she uses as needed, but that has made her even more anxious and she only wants to take the succinate.  I think that  is appropriate.  She is very comfortable going home and following up with her regular doctor.  She will continue taking her regular medications.  There is no evidence of an emergent medical condition requiring hospitalization at this time and I gave my usual and customary return precautions. [CF]    Clinical Course User Index [CF] Loleta Rose, MD     ____________________________________________  FINAL CLINICAL IMPRESSION(S) / ED DIAGNOSES  Final diagnoses:  Anxiety  Essential hypertension     MEDICATIONS GIVEN DURING THIS VISIT:  Medications  metoprolol succinate (TOPROL-XL) 24 hr tablet 25 mg (25 mg Oral Given 01/24/21 0253)     ED Discharge Orders     None        Note:  This document was prepared using Dragon voice recognition software and may include unintentional dictation errors.   Loleta Rose, MD 01/24/21 [id number]    Loleta Rose, MD 01/24/21 [phone]

## 2021-01-24 NOTE — ED Notes (Signed)
E-signature pad unavailable - Pt verbalized understanding of D/C information - no additional concerns at this time.  

## 2021-01-24 NOTE — Discharge Instructions (Addendum)
Your workup in the Emergency Department today was reassuring.  We did not find any specific abnormalities other than your elevated blood pressure which is essentially normal for you.  It is very likely that your anxiety is contributing to your symptoms as well.  We recommend you drink plenty of fluids, take your regular medications and/or any new ones prescribed today, and follow up with the doctor(s) listed in these documents as recommended.  Return to the Emergency Department if you develop new or worsening symptoms that concern you.

## 2021-04-29 ENCOUNTER — Ambulatory Visit (INDEPENDENT_AMBULATORY_CARE_PROVIDER_SITE_OTHER): Payer: Medicare Other | Admitting: Nurse Practitioner

## 2021-04-29 ENCOUNTER — Other Ambulatory Visit: Payer: Self-pay

## 2021-04-29 ENCOUNTER — Encounter: Payer: Self-pay | Admitting: Nurse Practitioner

## 2021-04-29 VITALS — BP 180/70 | HR 71 | Ht 70.0 in | Wt 214.0 lb

## 2021-04-29 DIAGNOSIS — I471 Supraventricular tachycardia: Secondary | ICD-10-CM | POA: Diagnosis not present

## 2021-04-29 DIAGNOSIS — R011 Cardiac murmur, unspecified: Secondary | ICD-10-CM

## 2021-04-29 DIAGNOSIS — I10 Essential (primary) hypertension: Secondary | ICD-10-CM

## 2021-04-29 DIAGNOSIS — I493 Ventricular premature depolarization: Secondary | ICD-10-CM | POA: Diagnosis not present

## 2021-04-29 MED ORDER — LISINOPRIL 30 MG PO TABS: 30.0000 mg | ORAL_TABLET | Freq: Every day | ORAL | Status: DC

## 2021-04-29 MED ORDER — CARVEDILOL 6.25 MG PO TABS: 6.2500 mg | ORAL_TABLET | Freq: Two times a day (BID) | ORAL | 3 refills | Status: DC

## 2021-04-29 NOTE — Progress Notes (Addendum)
Cardiology Office Note:    Date:  04/29/2021   ID:  Jordan Barron, DOB October 29, 1950, MRN [id number]  PCP:  Shanon Rosser, PA-C   [facility] Providers Cardiologist:  Nelva Bush, MD     Referring MD: Shanon Rosser, PA-C   Chief complaint: follow-up for PSVT  History of Present Illness:    Jordan Barron is a 71 y.o. female with a hx of PSVT, HTN, chronic HFpEF.  She was previously seen by Dr. Mare Ferrari for SVT in June, 2016 who recommended metoprolol succinate 12.5 mg daily.  Holter monitor ordered at that time does not appear as if it was completed and echocardiogram revealed LVEF 65%, no regional wall abnormalities, normal RV.  She had 1 episode of SVT 2/17 for which she was treated with adenosine 6 mg x 1 with return to SR. She was lost in follow-up to for a few years and returned to see Dr. Saunders Revel on 06/18/2017 to reestablish care.  She was given an additional prescription of metoprolol tartrate 25 mg to be taken twice daily as needed for palpitations.  She was encouraged to follow-up in 6 months.  She returned in May 2021 and reported 1 episode of tachypalpitations which she terminated with vagal maneuvers.   She was last seen in our office 07/14/2020 by Christell Faith, PA.  She reported no recent episodes of paroxysmal SVT and no use of as needed Lopressor.  Her blood pressure remained elevated in the office and she reported a long history of whitecoat hypertension with home blood pressure typically in the 140s millimeters mercury systolic.  She was using a wrist sphygmomanometer at home and declined up titration of anti-hypertensives until she could get a brachial sphygmomanometer and trend home BP.  She was continued on lisinopril 30 mg daily and Toprol XL 25 mg daily.  She was encouraged to follow-up in 2 months.  She presented to the [facility] ED on 09/19/20 for evaluation of dizziness.  She felt that she had had an episode of SVT earlier that evening that resolved with bearing down and taking  extra metoprolol tartrate however she continued to have dizziness worse with laying flat or moving around.  She was admitted and evaluated by neurology and found to have benign positional vertigo.  Her pain MRI was negative for stroke and Chiari malformation type I was noted.  She was discharged on 09/20/20.  She had visit to [facility] ED on 01/24/2021 for abdominal pain with negative abdominal CT and was discharged home.      Today, she is here alone for follow-up.  She reports she is very concerned about her home heart rate readings in the 50s bpm that she is checking with a pulse oximeter.  She stopped taking Toprol several days ago and additionally stopped lisinopril thinking that it was contributing to her low heart rate.  She was recently seen by primary care and was advised to start atorvastatin for high cholesterol and metformin for elevated A1c.  She has not stopped any of these for fear that they will interfere with her heart. She previously took aspirin but is not sure why it was prescribed and she has recently stopped taking it due to feeling dizzy when she takes it.   Past Medical History:  Diagnosis Date   Diastolic dysfunction    a. 09/2014 Echo: EF 65%, no rwma; b. 11/2019 Echo: EF 55-60%, no rwam, GrII DD. Nl RV size/fxn. Mild AoV sclerosis w/o stenosis.   Hypertension  Paroxysmal SVT (supraventricular tachycardia) (HCC)     Past Surgical History:  Procedure Laterality Date   ABDOMINAL HYSTERECTOMY      Current Medications: Current Meds  Medication Sig   ascorbic acid (VITAMIN C) 500 MG tablet Take 500 mg by mouth daily.   aspirin EC 81 MG tablet Take 81 mg by mouth daily.   carvedilol (COREG) 6.25 MG tablet Take 1 tablet (6.25 mg total) by mouth 2 (two) times daily.   fluticasone (FLONASE) 50 MCG/ACT nasal spray Place 2 sprays into both nostrils daily as needed for allergies.   Multiple Vitamin (MULTIVITAMIN) capsule Take 1 capsule by mouth daily.   pantoprazole (PROTONIX) 20 MG  tablet Take 20 mg by mouth 2 (two) times daily.   [DISCONTINUED] metoprolol tartrate (LOPRESSOR) 25 MG tablet Take 25 mg by mouth 2 (two) times daily as needed (heart rate >110).     Allergies:   Patient has no known allergies.   Social History   Socioeconomic History   Marital status: Single    Spouse name: Not on file   Number of children: Not on file   Years of education: Not on file   Highest education level: Not on file  Occupational History   Not on file  Tobacco Use   Smoking status: Never   Smokeless tobacco: Never  Substance and Sexual Activity   Alcohol use: No   Drug use: No   Sexual activity: Not on file  Other Topics Concern   Not on file  Social History Narrative   Not on file   Social Determinants of Health   Financial Resource Strain: Not on file  Food Insecurity: Not on file  Transportation Needs: Not on file  Physical Activity: Not on file  Stress: Not on file  Social Connections: Not on file     Family History: The patient's family history includes Other in her mother.  ROS:   Please see the history of present illness. All other systems reviewed and are negative.  Labs/Other Studies Reviewed:    The following studies were reviewed today:  Echo 8/21   Left Ventricle: Left ventricular ejection fraction, by estimation, is 55  to 60%. The left ventricle has normal function. The left ventricle has no  regional wall motion abnormalities. The left ventricular internal cavity  size was normal in size. There is  no left ventricular hypertrophy. Left ventricular diastolic parameters are consistent with Grade II diastolic dysfunction (pseudonormalization).  Right Ventricle: The right ventricular size is normal. No increase in  right ventricular wall thickness. Right ventricular systolic function is  normal.  Left Atrium: Left atrial size was normal in size.  Right Atrium: Right atrial size was normal in size.  Pericardium: There is no evidence of  pericardial effusion.  Mitral Valve: The mitral valve is normal in structure. Normal mobility of  the mitral valve leaflets. No evidence of mitral valve regurgitation. No  evidence of mitral valve stenosis.  Tricuspid Valve: The tricuspid valve is normal in structure. Tricuspid  valve regurgitation is not demonstrated. No evidence of tricuspid  stenosis.  Aortic Valve: The aortic valve is tricuspid. Aortic valve regurgitation is  not visualized. Mild aortic valve sclerosis is present, with no evidence  of aortic valve stenosis. Aortic valve mean gradient measures 3.0 mmHg.  Aortic valve peak gradient measures 6.0 mmHg. Aortic valve area, by VTI measures 3.32 cm.  Pulmonic Valve: The pulmonic valve was not well visualized. Pulmonic valve  regurgitation is not visualized. No evidence  of pulmonic stenosis.  Aorta: The aortic root is normal in size and structure.  Venous: The inferior vena cava is normal in size with greater than 50%  respiratory variability, suggesting right atrial pressure of 3 mmHg.  IAS/Shunts: No atrial level shunt detected by color flow Doppler.    2D echo 09/2014 Left ventricle: Normal cavity size. Wall thickness normal. The estimated ejectioin fraction was 65%. Wall motion normal, no regional wall motion abnormalities. Right ventricle: The cavity size was normal. Systolic function was normal.     Recent Labs: 01/23/2021: ALT 18; BUN 18; Creatinine, Ser 0.72; Hemoglobin 13.8; Magnesium 2.2; Platelets 325; Potassium 3.9; Sodium 136; TSH 1.858  Recent Lipid Panel No results found for: CHOL, TRIG, HDL, CHOLHDL, VLDL, LDLCALC, LDLDIRECT   Risk Assessment/Calculations:      Physical Exam:    VS:  BP (!) 180/70 (BP Location: Left Arm, Patient Position: Sitting, Cuff Size: Normal)    Pulse 71    Ht 5\' 10"  (1.778 m)    Wt 214 lb (97.1 kg)    SpO2 98%    BMI 30.71 kg/m     Wt Readings from Last 3 Encounters:  04/29/21 214 lb (97.1 kg)  01/23/21 214 lb (97.1 kg)   09/18/20 215 lb (97.5 kg)     GEN:  Well nourished, well developed in no acute distress HEENT: Normal NECK: No JVD; No carotid bruits CARDIAC: RRR, 2/6 systolic murmur RUSB. No rubs or gallops RESPIRATORY:  Clear to auscultation without rales, wheezing or rhonchi  ABDOMEN: Soft, non-tender, non-distended MUSCULOSKELETAL:  No edema; No deformity. 2+ pedal pulses, equal bilaterally SKIN: Warm and dry NEUROLOGIC:  Alert and oriented x 3 PSYCHIATRIC:  Normal affect   EKG:  EKG is ordered today.  The ekg ordered today demonstrates SR with frequent PVCs, LAFB, no acute ST/T wave changes   Diagnoses:    1. Paroxysmal SVT (supraventricular tachycardia) ([place])   2. Murmur   3. Frequent PVCs   4. Essential hypertension    Assessment and Plan:     Frequent PVCs: She denies palpitations or fluttering.  We do not have previous recording of frequent PVCs.  She was previously advised to wear a heart monitor but there is no record of completion. She has not been taking beta-blocker for fear of low heart rate reading on home pulse oximeter. I reviewed her EKG with her and the frequency of the PVCs that do not perfuse on the pulse oximeter which is why she is getting a lower heart rate reading at home. We will discontinue metoprolol (both tartrate and succinate) and start carvedilol 6.25 mg twice daily. We will get an echo to evaluate heart function and in the setting of murmur as noted below.  We will plan for follow-up in 2 weeks and will consider placing a monitor at that time if she continues to have frequent PVCs. We will get TSH, magnesium level, and basic metabolic panel on the day of her echocardiogram.  Essential hypertension: Blood pressure is elevated in clinic today and she brings home blood pressure readings that are also all above [id number] systolic.  She was prescribed lisinopril 30 mg by PCP but stopped it this week due to concerns about her low heart rate. I explained to her that lisinopril  will not lower her heart rate and encouraged her to resume lisinopril 30 mg.  Encouraged her to continue to monitor home blood pressure readings.  Encouraged 150 minutes of moderate physical activity each week, low-sodium diet,  weight loss. We will discontinue metoprolol (both succinate and tartrate) and start carvedilol 6.25 mg twice daily.   Murmur: She has a 2/6 systolic murmur at the right upper sternal border.  She had aortic sclerosis without stenosis on echo 8/21.  We will get echo to evaluate valve function.  PSVT: Had no recent recurrence of SVT.  She recently discontinued metoprolol succinate in the setting of concerns of low heart rate per home pulse oximeter. She continues to carry the metoprolol tartrate in the event that she develops SVT.  As noted above today she has frequent PVCs on her EKG.  We will DC metoprolol tartrate start carvedilol 6.25 mg twice daily.   Chronic HFpEF: LVEF D 5 to 60%, G2DD, no LVH, no regional wall motion abnormalities, normal RV by echo 8/21.  She is euvolemic on exam.  She denies edema, orthopnea, PND, dyspnea.  I explained the importance of maintaining a healthy weight, good blood pressure control, low-sodium diet in the setting of diastolic dysfunction.  She is agreeable to start carvedilol and resume lisinopril for better blood pressure control.  Information given to her on her AVS on low-sodium diet and exercise guidelines from the [employer].   Hyperlipidemia, unspecified: Recently advised to start atorvastatin by PCP. I do not have access to her lab results but encouraged her to follow their guidance and reviewed the benefits of lowering cholesterol. Management per PCP.     Disposition: f/u with me in 3 weeks   Medication Adjustments/Labs and Tests Ordered: Current medicines are reviewed at length with the patient today.  Concerns regarding medicines are outlined above.  Orders Placed This Encounter  Procedures   Basic metabolic  panel   Magnesium   TSH   EKG 12-Lead   ECHOCARDIOGRAM COMPLETE   Meds ordered this encounter  Medications   lisinopril (ZESTRIL) 30 MG tablet    Sig: Take 1 tablet (30 mg total) by mouth daily.   carvedilol (COREG) 6.25 MG tablet    Sig: Take 1 tablet (6.25 mg total) by mouth 2 (two) times daily.    Dispense:  180 tablet    Refill:  3    Patient Instructions  Medication Instructions:  Your physician has recommended you make the following change in your medication:   STOP Metoprolol START Carvedilol 6.25 mg twice a day  *If you need a refill on your cardiac medications before your next appointment, please call your pharmacy*   Lab Work: BMET, Mag, and TSH to be done when you come in for the echocardiogram.   If you have labs (blood work) drawn today and your tests are completely normal, you will receive your results only by: [place] (if you have MyChart) OR A paper copy in the mail If you have any lab test that is abnormal or we need to change your treatment, we will call you to review the results.   Testing/Procedures: Your physician has requested that you have an echocardiogram. Echocardiography is a painless test that uses sound waves to create images of your heart. It provides your doctor with information about the size and shape of your heart and how well your hearts chambers and valves are working. This procedure takes approximately one hour. There are no restrictions for this procedure.    Follow-Up: At [facility], you and your health needs are our priority.  As part of our continuing mission to provide you with exceptional heart care, we have created designated Provider Care Teams.  These  Care Teams include your primary Cardiologist (physician) and Advanced Practice Providers (APPs -  Physician Assistants and Nurse Practitioners) who all work together to provide you with the care you need, when you need it.   Your next appointment:   Use TOC  appointment on 2/6 or 2/7 on RD schedule.  The format for your next appointment:   In Person  Provider:   Christen Bame {  Exercise recommendations: Goal of exercising for at least 30 minutes a day, at least 5 times per week.  Please exercise to a moderate exertion.  This means that while exercising it is difficult to speak in full sentences, however you are not so short of breath that you feel you must stop, and not so comfortable that you can carry on a full conversation.  Exertion level should be approximately a 5/10, if 10 is the most exertion you can perform.  Diet recommendations: Recommend a heart healthy diet such as the Mediterranean diet.  This diet consists of plant based foods, healthy fats, lean meats, olive oil.  It suggests limiting the intake of simple carbohydrates such as white breads, pastries, and pastas.  It also limits the amount of red meat, wine, and dairy products such as cheese that one should consume on a daily basis.    Signed, Emmaline Life, NP  04/29/2021 12:34 PM    [facility]

## 2021-04-29 NOTE — Patient Instructions (Addendum)
Medication Instructions:  Your physician has recommended you make the following change in your medication:   STOP Metoprolol START Carvedilol 6.25 mg twice a day  *If you need a refill on your cardiac medications before your next appointment, please call your pharmacy*   Lab Work: BMET, Mag, and TSH to be done when you come in for the echocardiogram.   If you have labs (blood work) drawn today and your tests are completely normal, you will receive your results only by: [place] (if you have MyChart) OR A paper copy in the mail If you have any lab test that is abnormal or we need to change your treatment, we will call you to review the results.   Testing/Procedures: Your physician has requested that you have an echocardiogram. Echocardiography is a painless test that uses sound waves to create images of your heart. It provides your doctor with information about the size and shape of your heart and how well your hearts chambers and valves are working. This procedure takes approximately one hour. There are no restrictions for this procedure.    Follow-Up: At [facility], you and your health needs are our priority.  As part of our continuing mission to provide you with exceptional heart care, we have created designated Provider Care Teams.  These Care Teams include your primary Cardiologist (physician) and Advanced Practice Providers (APPs -  Physician Assistants and Nurse Practitioners) who all work together to provide you with the care you need, when you need it.   Your next appointment:   Use TOC appointment on 2/6 or 2/7 on RD schedule.  The format for your next appointment:   In Person  Provider:   Christen Bame {  Exercise recommendations: Goal of exercising for at least 30 minutes a day, at least 5 times per week.  Please exercise to a moderate exertion.  This means that while exercising it is difficult to speak in full sentences, however you are not so short of  breath that you feel you must stop, and not so comfortable that you can carry on a full conversation.  Exertion level should be approximately a 5/10, if 10 is the most exertion you can perform.  Diet recommendations: Recommend a heart healthy diet such as the Mediterranean diet.  This diet consists of plant based foods, healthy fats, lean meats, olive oil.  It suggests limiting the intake of simple carbohydrates such as white breads, pastries, and pastas.  It also limits the amount of red meat, wine, and dairy products such as cheese that one should consume on a daily basis.

## 2021-05-12 ENCOUNTER — Ambulatory Visit (INDEPENDENT_AMBULATORY_CARE_PROVIDER_SITE_OTHER): Payer: Medicare Other

## 2021-05-12 ENCOUNTER — Other Ambulatory Visit (INDEPENDENT_AMBULATORY_CARE_PROVIDER_SITE_OTHER): Payer: Medicare Other

## 2021-05-12 ENCOUNTER — Other Ambulatory Visit: Payer: Self-pay

## 2021-05-12 DIAGNOSIS — I471 Supraventricular tachycardia: Secondary | ICD-10-CM | POA: Diagnosis not present

## 2021-05-12 DIAGNOSIS — R011 Cardiac murmur, unspecified: Secondary | ICD-10-CM

## 2021-05-12 LAB — ECHOCARDIOGRAM COMPLETE
AR max vel: 3.52 cm2
AV Area VTI: 3.17 cm2
AV Area mean vel: 3.37 cm2
AV Mean grad: 3 mmHg
AV Peak grad: 6.9 mmHg
Ao pk vel: 1.31 m/s
Area-P 1/2: 2.55 cm2
S' Lateral: 3.5 cm
Single Plane A4C EF: 50.7 %

## 2021-05-13 LAB — BASIC METABOLIC PANEL
BUN/Creatinine Ratio: 23 (ref 12–28)
BUN: 17 mg/dL (ref 8–27)
CO2: 21 mmol/L (ref 20–29)
Calcium: 10.1 mg/dL (ref 8.7–10.3)
Chloride: 102 mmol/L (ref 96–106)
Creatinine, Ser: 0.74 mg/dL (ref 0.57–1.00)
Glucose: 113 mg/dL — ABNORMAL HIGH (ref 70–99)
Potassium: 4.3 mmol/L (ref 3.5–5.2)
Sodium: 140 mmol/L (ref 134–144)
eGFR: 86 mL/min/{1.73_m2} (ref >59)

## 2021-05-13 LAB — TSH: TSH: 2.34 u[IU]/mL (ref 0.450–4.500)

## 2021-05-13 LAB — MAGNESIUM: Magnesium: 2.2 mg/dL (ref 1.6–2.3)

## 2021-05-13 NOTE — Progress Notes (Addendum)
Cardiology Office Note:    Date:  05/16/2021   ID:  Jordan Barron, DOB 1950-06-14, MRN [id number]  PCP:  Shanon Rosser, PA-C   [facility] Providers Cardiologist:  Nelva Bush, MD     Referring MD: Shanon Rosser, PA-C   Chief complaint: follow-up for palpitations, frequent PVCs  History of Present Illness:    Jordan Barron is a 71 y.o. female with a hx of PSVT, palpitations, frequent PVCs, HTN, and chronic HFpEF.  She was previously seen by Dr. Mare Ferrari for SVT and prescribed metoprolol succinate.  Holter monitor was ordered but not completed and echo revealed LVEF 65%, no regional wall abnormalities, normal RV.  She had 1 episode of SVT 2/17 for which she was treated with adenosine 6 mg x 1 with return to SR. She was lost in follow-up to for a few years and returned to see Dr. Saunders Revel on 06/18/2017 to reestablish care.  She was given an additional prescription of metoprolol tartrate 25 mg to be taken twice daily as needed for palpitations.  She was encouraged to follow-up in 6 months.  She returned in May 2021 and reported 1 episode of tachypalpitations which she terminated with vagal maneuvers.   At office visit 07/14/2020, she reported no recent episodes of paroxysmal SVT and no use of as needed Lopressor.  Her blood pressure remained elevated in the office and she reported a long history of whitecoat hypertension with home SBP  typically in the 140s mmHg.  She was using a wrist sphygmomanometer at home and declined up titration of anti-hypertensives until she could get a brachial sphygmomanometer and trend home BP.  She was continued on lisinopril 30 mg daily and Toprol XL 25 mg daily.  She was encouraged to follow-up in 2 months.  She presented to the [facility] ED on 09/19/20 for evaluation of dizziness.  She felt that she had had an episode of SVT earlier that evening that resolved with bearing down and taking extra metoprolol tartrate however she continued to have dizziness worse with laying  flat or moving around.  She was admitted and evaluated by neurology and found to have benign positional vertigo.  MRI was negative for stroke and Chiari malformation type I was noted.  She was discharged on 09/20/20.  She had visit to [facility] ED on 01/24/2021 for abdominal pain with negative abdominal CT and was discharged home.      At last office visit with me on 04/29/21, she was very concerned about home HR readings in the 50s bpm with pulse oximeter.  She stopped taking Toprol several days prior and additionally stopped lisinopril thinking that it was contributing to her low heart rate.  She was recently seen by PCP and advised to start atorvastatin for cholesterol and metformin for elevated A1c.  She has not started any of these for fear that they will interfere with her heart. She previously took aspirin but stopped it due to dizziness.  Today, she returns for follow-up of frequent PVCs. Admits she is very anxious. Not currently taking carvedilol, lisinopril, atorvastatin, or metformin as prescribed due for concerns over low heart rate.  Home blood pressure log reveals mostly elevated BPs greater than 140/80.  She has a few occasions where blood pressure is well controlled. Pulse readings range from upper 40s to 60s bpm which is concerning to her despite explanation that pulse ox is not correct due to frequent PVCs. She denies chest pain, shortness of breath, lower extremity edema, fatigue, palpitations, melena,  hematuria, hemoptysis, diaphoresis, weakness, presyncope, syncope, orthopnea, and PND. Occasional lightheadedness. She admits she does not drink enough water. Not getting regular exercise. Would like agreement that it is safe to travel to [place] in April to visit her mother. Treatment plan reviewed with patient's sister by phone per her request.    Past Medical History:  Diagnosis Date   Diastolic dysfunction    a. 09/2014 Echo: EF 65%, no rwma; b. 11/2019 Echo: EF 55-60%, no rwam, GrII DD. Nl RV  size/fxn. Mild AoV sclerosis w/o stenosis.   Hypertension    Paroxysmal SVT (supraventricular tachycardia) (HCC)     Past Surgical History:  Procedure Laterality Date   ABDOMINAL HYSTERECTOMY      Current Medications: Current Meds  Medication Sig   ascorbic acid (VITAMIN C) 500 MG tablet Take 500 mg by mouth daily.   carvedilol (COREG) 3.125 MG tablet Take 1 tablet (3.125 mg total) by mouth 2 (two) times daily.   fluticasone (FLONASE) 50 MCG/ACT nasal spray Place 2 sprays into both nostrils daily as needed for allergies.   metoprolol tartrate (LOPRESSOR) 25 MG tablet Take 25 mg by mouth 2 (two) times daily as needed (SVT).   Multiple Vitamin (MULTIVITAMIN) capsule Take 1 capsule by mouth daily.   pantoprazole (PROTONIX) 20 MG tablet Take 20 mg by mouth 2 (two) times daily.     Allergies:   Patient has no known allergies.   Social History   Socioeconomic History   Marital status: Single    Spouse name: Not on file   Number of children: Not on file   Years of education: Not on file   Highest education level: Not on file  Occupational History   Not on file  Tobacco Use   Smoking status: Never   Smokeless tobacco: Never  Substance and Sexual Activity   Alcohol use: No   Drug use: No   Sexual activity: Not on file  Other Topics Concern   Not on file  Social History Narrative   Not on file   Social Determinants of Health   Financial Resource Strain: Not on file  Food Insecurity: Not on file  Transportation Needs: Not on file  Physical Activity: Not on file  Stress: Not on file  Social Connections: Not on file     Family History: The patient's family history includes Other in her mother.  ROS:   Please see the history of present illness. All other systems reviewed and are negative.  Labs/Other Studies Reviewed:    The following studies were reviewed today:  Echo 05/12/21   Left Ventricle: Left ventricular ejection fraction, by estimation, is 60  to 65%. The  left ventricle has normal function. The left ventricle has no  regional wall motion abnormalities. The left ventricular internal cavity  size was normal in size. There is  no left ventricular hypertrophy. Left ventricular diastolic parameters are consistent with Grade I diastolic dysfunction (impaired relaxation).  Right Ventricle: The right ventricular size is normal. No increase in  right ventricular wall thickness. Right ventricular systolic function is  normal. Tricuspid regurgitation signal is inadequate for assessing PA  pressure.  Left Atrium: Left atrial size was mildly dilated.  Right Atrium: Right atrial size was normal in size.  Pericardium: There is no evidence of pericardial effusion.  Mitral Valve: The mitral valve is normal in structure. Mild mitral valve  regurgitation. No evidence of mitral valve stenosis.  Tricuspid Valve: The tricuspid valve is normal in structure. Tricuspid  valve  regurgitation is not demonstrated. No evidence of tricuspid  stenosis.  Aortic Valve: The aortic valve is normal in structure. Aortic valve  regurgitation is not visualized. Aortic valve sclerosis is present, with  no evidence of aortic valve stenosis. Aortic valve mean gradient measures  3.0 mmHg. Aortic valve peak gradient measures 6.9 mmHg. Aortic valve area, by VTI measures 3.17 cm.  Pulmonic Valve: The pulmonic valve was normal in structure. Pulmonic valve  regurgitation is not visualized. No evidence of pulmonic stenosis.  Aorta: The aortic root is normal in size and structure.  Venous: The inferior vena cava is normal in size with greater than 50%  respiratory variability, suggesting right atrial pressure of 3 mmHg.  IAS/Shunts: No atrial level shunt detected by color flow Doppler.   Echo 8/21   Left Ventricle: Left ventricular ejection fraction, by estimation, is 55  to 60%. The left ventricle has normal function. The left ventricle has no  regional wall motion abnormalities. The  left ventricular internal cavity  size was normal in size. There is  no left ventricular hypertrophy. Left ventricular diastolic parameters are consistent with Grade II diastolic dysfunction (pseudonormalization).  Right Ventricle: The right ventricular size is normal. No increase in  right ventricular wall thickness. Right ventricular systolic function is  normal.  Left Atrium: Left atrial size was normal in size.  Right Atrium: Right atrial size was normal in size.  Pericardium: There is no evidence of pericardial effusion.  Mitral Valve: The mitral valve is normal in structure. Normal mobility of  the mitral valve leaflets. No evidence of mitral valve regurgitation. No  evidence of mitral valve stenosis.  Tricuspid Valve: The tricuspid valve is normal in structure. Tricuspid  valve regurgitation is not demonstrated. No evidence of tricuspid  stenosis.  Aortic Valve: The aortic valve is tricuspid. Aortic valve regurgitation is  not visualized. Mild aortic valve sclerosis is present, with no evidence  of aortic valve stenosis. Aortic valve mean gradient measures 3.0 mmHg.  Aortic valve peak gradient measures 6.0 mmHg. Aortic valve area, by VTI measures 3.32 cm.  Pulmonic Valve: The pulmonic valve was not well visualized. Pulmonic valve  regurgitation is not visualized. No evidence of pulmonic stenosis.  Aorta: The aortic root is normal in size and structure.  Venous: The inferior vena cava is normal in size with greater than 50%  respiratory variability, suggesting right atrial pressure of 3 mmHg.  IAS/Shunts: No atrial level shunt detected by color flow Doppler.    2D echo 09/2014 Left ventricle: Normal cavity size. Wall thickness normal. The estimated ejectioin fraction was 65%. Wall motion normal, no regional wall motion abnormalities. Right ventricle: The cavity size was normal. Systolic function was normal.     Recent Labs: 01/23/2021: ALT 18; Hemoglobin 13.8; Platelets  325 05/12/2021: BUN 17; Creatinine, Ser 0.74; Magnesium 2.2; Potassium 4.3; Sodium 140; TSH 2.340  Recent Lipid Panel No results found for: CHOL, TRIG, HDL, CHOLHDL, VLDL, LDLCALC, LDLDIRECT   Risk Assessment/Calculations:      Physical Exam:    VS:  BP (!) 160/100 (BP Location: Left Arm, Patient Position: Sitting, Cuff Size: Normal)    Pulse 71    Ht 5\' 10"  (1.778 m)    Wt 215 lb 6 oz (97.7 kg)    SpO2 98%    BMI 30.90 kg/m     Wt Readings from Last 3 Encounters:  05/16/21 215 lb 6 oz (97.7 kg)  04/29/21 214 lb (97.1 kg)  01/23/21 214 lb (97.1 kg)  GEN:  Well nourished, well developed in no acute distress HEENT: Normal NECK: No JVD; No carotid bruits CARDIAC: RRR. No murmur, rubs or gallops RESPIRATORY:  Clear to auscultation without rales, wheezing or rhonchi  ABDOMEN: Soft, non-tender, non-distended MUSCULOSKELETAL:  No edema; No deformity. 2+ pedal pulses, equal bilaterally SKIN: Warm and dry NEUROLOGIC:  Alert and oriented x 3 PSYCHIATRIC:  Normal affect   EKG:  EKG is not ordered today  Diagnoses:    1. Essential hypertension   2. Heart palpitations   3. Frequent PVCs   4. Mild mitral regurgitation   5. Paroxysmal SVT (supraventricular tachycardia) (HCC)   6. Hyperlipidemia, unspecified hyperlipidemia type     Assessment and Plan:     Frequent PVCs/Palpitations: Has palpitations in her neck/throat. She has not been taking beta-blocker for fear of low heart rate reading on home pulse oximeter despite explanation that frequent PVCs do not perfuse on the pulse oximeter. Echo revealed mild MR, do not suspect contributory to palpitations. She wants to hold off on monitor for now. She is agreeable to start carvedilol 3.125 mg twice daily and monitor HR and BP at home for 2 weeks. She will call with those readings. Would favor increase in carvedilol dose and placement of 3-7 day Zio if she continues to have palps.   Essential hypertension: Not compliant on lisinopril.  Home BP readings remain elevated >140/80 with the exception of 3 readings in 2 weeks. She prefers to start one medication at a time. Will hold lisinopril for now. Start carvedilol 3.125 mg bid and monitor home readings x 2 weeks. Encouraged 150 minutes of moderate physical activity each week, low-sodium diet, weight loss. Would favor addition of lisinopril 20 mg if BP remains elevated. As noted above, if palps continue can increase carvedilol to 6.25 mg BID.   Mild mitral regurgitation: Echo 2/23 mild MR. I do not appreciate a murmur today. No further evaluation warranted at this time. Will continue to follow by echo in 3-5 years, sooner if symptoms warrant.   PSVT: She denies recent event of SVT.  She continues to carry the metoprolol tartrate in the event that she develops SVT. Starting carvedilol 3.125 mg as noted above. She is concerned she will have SVT on her trip to [place], will continue lopressor 25 mg prn SVT.   Chronic HFpEF: LVEF 60-65%, G1DD, no LVH, no regional wall motion abnormalities, normal RV by echo 2/22.  Euvolemic on exam.  Denies edema, orthopnea, PND, dyspnea. Encouraged regular walking for exercise. Emphasis placed on good BP control for diastolic dysfunction. She politely declines to resume lisinopril. Agreeable to start carvedilol 3.125 mg BID.    Hyperlipidemia, unspecified: Recently advised to start atorvastatin by PCP. I do not have access to her lab results but encouraged her to follow their guidance and reviewed the benefits of lowering cholesterol. Management per PCP.     Disposition: 3 months with Dr. Saunders Revel   Medication Adjustments/Labs and Tests Ordered: Current medicines are reviewed at length with the patient today.  Concerns regarding medicines are outlined above.  No orders of the defined types were placed in this encounter.  Meds ordered this encounter  Medications   carvedilol (COREG) 3.125 MG tablet    Sig: Take 1 tablet (3.125 mg total) by mouth 2 (two)  times daily.    Dispense:  60 tablet    Refill:  3    Patient Instructions  Medication Instructions:  Your physician has recommended you make the following change in  your medication:   DECREASE Carvedilol to 3.125 mg twice a day  *If you need a refill on your cardiac medications before your next appointment, please call your pharmacy*   Lab Work: None  If you have labs (blood work) drawn today and your tests are completely normal, you will receive your results only by: [place] (if you have MyChart) OR A paper copy in the mail If you have any lab test that is abnormal or we need to change your treatment, we will call you to review the results.   Testing/Procedures:   Please monitor blood pressures and keep a log of your readings. Include date, time, and heart rate as well. Call us or send those readings through [facility] Chart.   Make sure to check 2 hours after your medications.   AVOID these things for 30 minutes before checking your blood pressure: No Drinking caffeine. No Drinking alcohol. No Eating. No Smoking. No Exercising.  Five minutes before checking your blood pressure: Pee. Sit in a dining chair. Avoid sitting in a soft couch or armchair. Be quiet. Do not talk.    Follow-Up: At [facility], you and your health needs are our priority.  As part of our continuing mission to provide you with exceptional heart care, we have created designated Provider Care Teams.  These Care Teams include your primary Cardiologist (physician) and Advanced Practice Providers (APPs -  Physician Assistants and Nurse Practitioners) who all work together to provide you with the care you need, when you need it.   Your next appointment:   3 month(s)  The format for your next appointment:   In Person  Provider:   Nelva Bush, MD or Christell Faith, PA-C    Signed, Penn Grissett, Lanice Schwab, NP  05/16/2021 11:39 AM    [place]

## 2021-05-16 ENCOUNTER — Other Ambulatory Visit: Payer: Self-pay

## 2021-05-16 ENCOUNTER — Ambulatory Visit (INDEPENDENT_AMBULATORY_CARE_PROVIDER_SITE_OTHER): Payer: Medicare Other | Admitting: Nurse Practitioner

## 2021-05-16 ENCOUNTER — Encounter: Payer: Self-pay | Admitting: Physician Assistant

## 2021-05-16 VITALS — BP 160/100 | HR 71 | Ht 70.0 in | Wt 215.4 lb

## 2021-05-16 DIAGNOSIS — I493 Ventricular premature depolarization: Secondary | ICD-10-CM | POA: Diagnosis not present

## 2021-05-16 DIAGNOSIS — I10 Essential (primary) hypertension: Secondary | ICD-10-CM | POA: Diagnosis not present

## 2021-05-16 DIAGNOSIS — I471 Supraventricular tachycardia: Secondary | ICD-10-CM

## 2021-05-16 DIAGNOSIS — I340 Nonrheumatic mitral (valve) insufficiency: Secondary | ICD-10-CM

## 2021-05-16 DIAGNOSIS — E785 Hyperlipidemia, unspecified: Secondary | ICD-10-CM

## 2021-05-16 DIAGNOSIS — R002 Palpitations: Secondary | ICD-10-CM | POA: Diagnosis not present

## 2021-05-16 MED ORDER — CARVEDILOL 3.125 MG PO TABS: 3.1250 mg | ORAL_TABLET | Freq: Two times a day (BID) | ORAL | 3 refills | Status: AC

## 2021-05-16 NOTE — Patient Instructions (Signed)
Medication Instructions:  Your physician has recommended you make the following change in your medication:   DECREASE Carvedilol to 3.125 mg twice a day  *If you need a refill on your cardiac medications before your next appointment, please call your pharmacy*   Lab Work: None  If you have labs (blood work) drawn today and your tests are completely normal, you will receive your results only by: [place] (if you have MyChart) OR A paper copy in the mail If you have any lab test that is abnormal or we need to change your treatment, we will call you to review the results.   Testing/Procedures:   Please monitor blood pressures and keep a log of your readings. Include date, time, and heart rate as well. Call us or send those readings through [facility] Chart.   Make sure to check 2 hours after your medications.   AVOID these things for 30 minutes before checking your blood pressure: No Drinking caffeine. No Drinking alcohol. No Eating. No Smoking. No Exercising.  Five minutes before checking your blood pressure: Pee. Sit in a dining chair. Avoid sitting in a soft couch or armchair. Be quiet. Do not talk.    Follow-Up: At [facility], you and your health needs are our priority.  As part of our continuing mission to provide you with exceptional heart care, we have created designated Provider Care Teams.  These Care Teams include your primary Cardiologist (physician) and Advanced Practice Providers (APPs -  Physician Assistants and Nurse Practitioners) who all work together to provide you with the care you need, when you need it.   Your next appointment:   3 month(s)  The format for your next appointment:   In Person  Provider:   Nelva Bush, MD or Christell Faith, PA-C

## 2021-05-19 ENCOUNTER — Telehealth: Payer: Self-pay | Admitting: Nurse Practitioner

## 2021-05-19 MED ORDER — LISINOPRIL 30 MG PO TABS: 15.0000 mg | ORAL_TABLET | Freq: Every day | ORAL | Status: DC

## 2021-05-19 NOTE — Telephone Encounter (Signed)
Spoke with patient and she stated at 0830 she took her first dose of Carvedilol 3.125 MG. She also has Lisinopril 30 MG on hand from a previous prescription.  Patient was seen on 05/16/2021 by Eligha Bridegroom, NP and the following was documented:  Essential hypertension: Not compliant on lisinopril. Home BP readings remain elevated >140/80 with the exception of 3 readings in 2 weeks. She prefers to start one medication at a time. Will hold lisinopril for now. Start carvedilol 3.125 mg bid and monitor home readings x 2 weeks. Encouraged 150 minutes of moderate physical activity each week, low-sodium diet, weight loss. Would favor addition of lisinopril 20 mg if BP remains elevated. As noted above, if palps continue can increase carvedilol to 6.25 mg BID.    Spoke with Nicolasa Ducking, NP and after reviewing the chart, he recommended that the patient take the Lisinopril 30 MG now, and daily if patient is willing, or 15 MG daily if she is not comfortable with the dose. Also recommended that the patient only check her BP 2 hours after her medication in the morning and after medication in the evening.  Advised patient of these recommendations. Patient was willing to take the Lisinopril 30 MG now, but was hesitant to take that much daily. She is worried about her heart rate being low on her home pulse oximeter. Patient has a history of PVCs, and I informed her that that does not perfuse well on the pulse oximeter. Patient agreed to take Lisinopril 15 MG (half of her tablet) daily for now.

## 2021-05-19 NOTE — Telephone Encounter (Signed)
Patient states she has started a new medication, Carvedilol and has caused her BP to be elevated.  Pt c/o BP issue: STAT if pt c/o blurred vision, one-sided weakness or slurred speech  1. What are your last 5 BP readings?  Monday- 166/78 HR 52 Tuesday-145/77 HR 48 Wednesday- 185/79 HR 57 This morning- 219/126 HR 58 waited 20 min 238/127 hr 64, at 11:40 201/90 , 12:05 180/93  2. Are you having any other symptoms (ex. Dizziness, headache, blurred vision, passed out)?  Lightheadedness , states she can "feel that is it high"  3. What is your BP issue? elevated   She would like to know if she can take Lisinopril to help lower hr BP. Please call and advise.

## 2021-08-06 ENCOUNTER — Emergency Department
Admission: EM | Admit: 2021-08-06 | Discharge: 2021-08-06 | Disposition: A | Discharge status: Home / Self Care | Payer: Medicare Other | Attending: Emergency Medicine | Admitting: Emergency Medicine

## 2021-08-06 ENCOUNTER — Other Ambulatory Visit: Payer: Self-pay

## 2021-08-06 ENCOUNTER — Encounter: Payer: Self-pay | Admitting: Emergency Medicine

## 2021-08-06 ENCOUNTER — Ambulatory Visit: Admission: EM | Admit: 2021-08-06 | Discharge: 2021-08-06 | Payer: Medicare Other

## 2021-08-06 ENCOUNTER — Emergency Department: Payer: Medicare Other

## 2021-08-06 DIAGNOSIS — R519 Headache, unspecified: Secondary | ICD-10-CM

## 2021-08-06 DIAGNOSIS — R42 Dizziness and giddiness: Secondary | ICD-10-CM

## 2021-08-06 DIAGNOSIS — R7989 Other specified abnormal findings of blood chemistry: Secondary | ICD-10-CM | POA: Diagnosis not present

## 2021-08-06 DIAGNOSIS — I10 Essential (primary) hypertension: Secondary | ICD-10-CM | POA: Insufficient documentation

## 2021-08-06 DIAGNOSIS — I160 Hypertensive urgency: Secondary | ICD-10-CM

## 2021-08-06 DIAGNOSIS — R6889 Other general symptoms and signs: Secondary | ICD-10-CM

## 2021-08-06 DIAGNOSIS — R9431 Abnormal electrocardiogram [ECG] [EKG]: Secondary | ICD-10-CM

## 2021-08-06 LAB — CBC WITH DIFFERENTIAL/PLATELET
Abs Immature Granulocytes: 0.03 10*3/uL (ref 0.00–0.07)
Basophils Absolute: 0.1 10*3/uL (ref 0.0–0.1)
Basophils Relative: 1 %
Eosinophils Absolute: 0.1 10*3/uL (ref 0.0–0.5)
Eosinophils Relative: 1 %
HCT: 49 % — ABNORMAL HIGH (ref 36.0–46.0)
Hemoglobin: 14.8 g/dL (ref 12.0–15.0)
Immature Granulocytes: 1 %
Lymphocytes Relative: 28 %
Lymphs Abs: 1.8 10*3/uL (ref 0.7–4.0)
MCH: 21.7 pg — ABNORMAL LOW (ref 26.0–34.0)
MCHC: 30.2 g/dL (ref 30.0–36.0)
MCV: 72 fL — ABNORMAL LOW (ref 80.0–100.0)
Monocytes Absolute: 0.3 10*3/uL (ref 0.1–1.0)
Monocytes Relative: 5 %
Neutro Abs: 4 10*3/uL (ref 1.7–7.7)
Neutrophils Relative %: 64 %
Platelets: 247 10*3/uL (ref 150–400)
RBC: 6.81 MIL/uL — ABNORMAL HIGH (ref 3.87–5.11)
RDW: 17.6 % — ABNORMAL HIGH (ref 11.5–15.5)
WBC: 6.2 10*3/uL (ref 4.0–10.5)
nRBC: 0 % (ref 0.0–0.2)

## 2021-08-06 LAB — COMPREHENSIVE METABOLIC PANEL
ALT: 19 U/L (ref 0–44)
AST: 29 U/L (ref 15–41)
Albumin: 4.2 g/dL (ref 3.5–5.0)
Alkaline Phosphatase: 107 U/L (ref 38–126)
Anion gap: 9 (ref 5–15)
BUN: 13 mg/dL (ref 8–23)
CO2: 24 mmol/L (ref 22–32)
Calcium: 9.6 mg/dL (ref 8.9–10.3)
Chloride: 104 mmol/L (ref 98–111)
Creatinine, Ser: 0.64 mg/dL (ref 0.44–1.00)
GFR, Estimated: >60 mL/min (ref >60)
Glucose, Bld: 128 mg/dL — ABNORMAL HIGH (ref 70–99)
Potassium: 4.9 mmol/L (ref 3.5–5.1)
Sodium: 137 mmol/L (ref 135–145)
Total Bilirubin: 0.8 mg/dL (ref 0.3–1.2)
Total Protein: 9.1 g/dL — ABNORMAL HIGH (ref 6.5–8.1)

## 2021-08-06 LAB — POCT FASTING CBG KUC MANUAL ENTRY: POCT Glucose (KUC): 131 mg/dL — AB (ref 70–99)

## 2021-08-06 IMAGING — CT CT HEAD W/O CM
4 series · 16 of 47 positions shown, 18 images · non-contrast
Comparison: [DATE]

CLINICAL DATA: Severe headaches, hypertension, dizziness



[Series 2: head wo · axial · 0.44mm/px · z∈[-98,+22]mm · 7 of 33 slices shown, 9 images]
[im 5/33  brain]
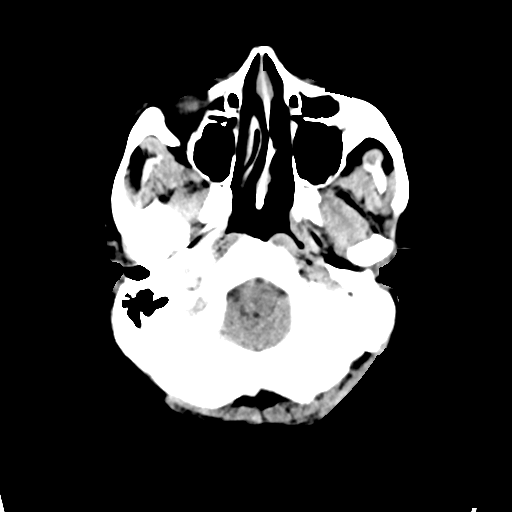
[im 5/33  bone]
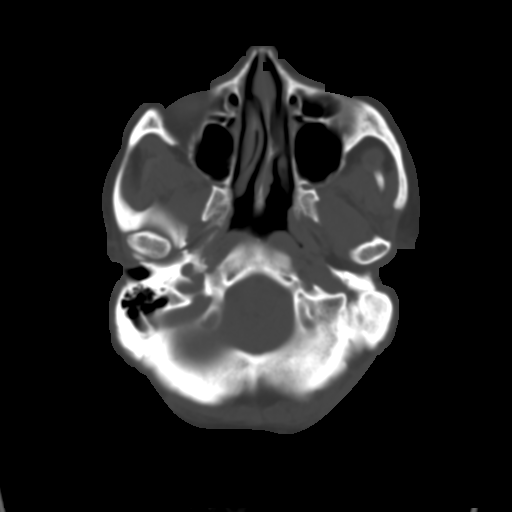
[im 9/33  brain]
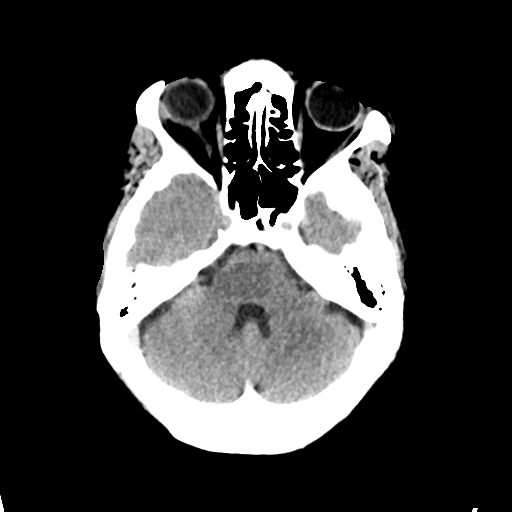
[im 13/33  brain]
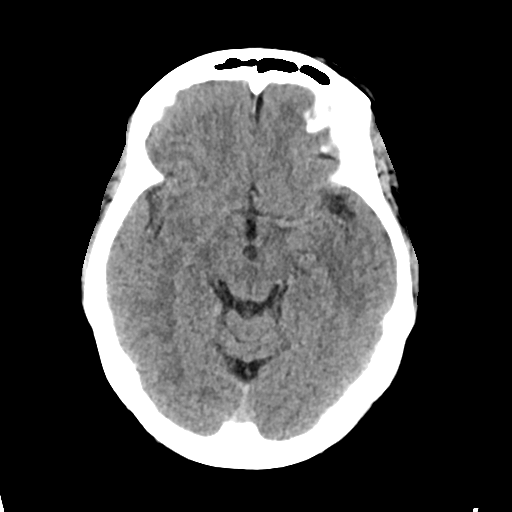
[im 17/33  brain]
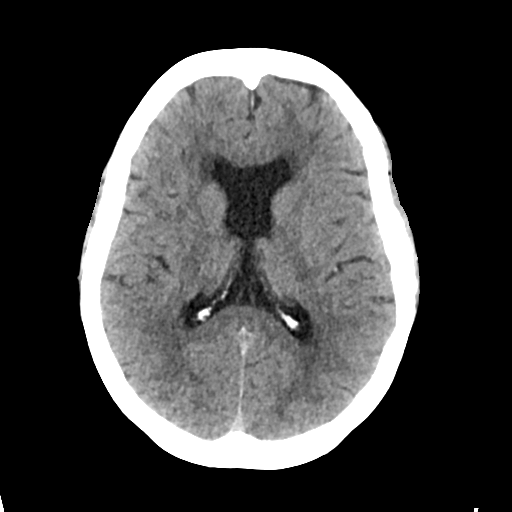
[im 21/33  brain]
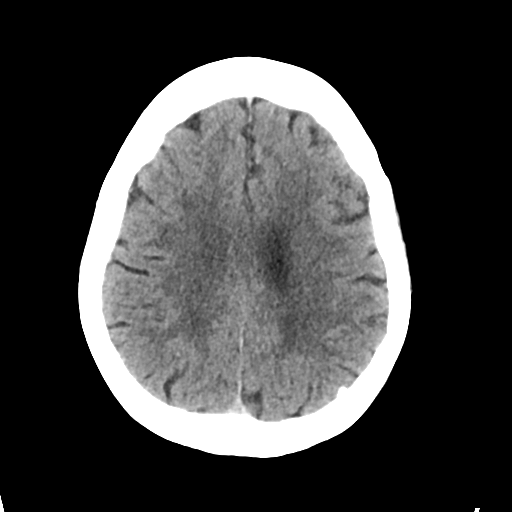
[im 21/33  bone]
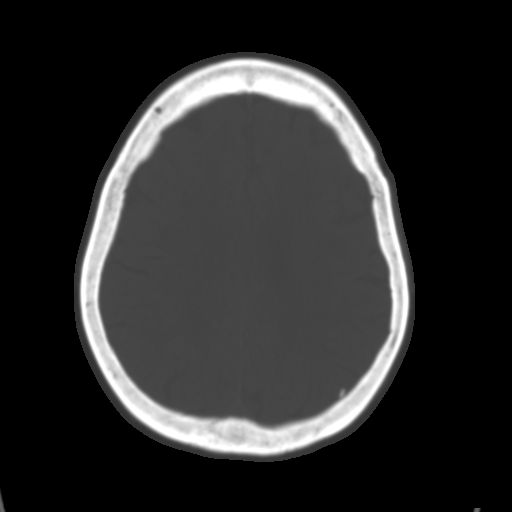
[im 25/33  brain]
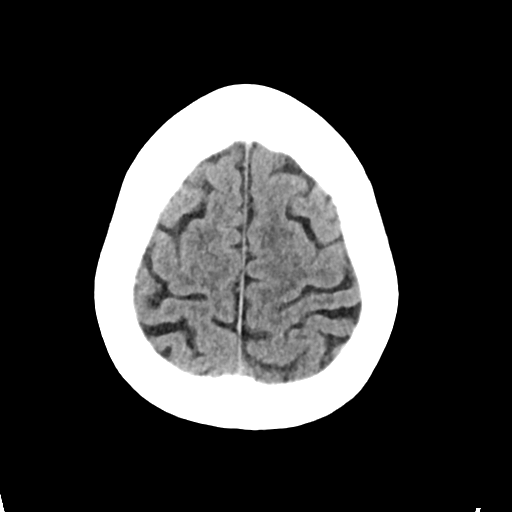
[im 29/33  brain]
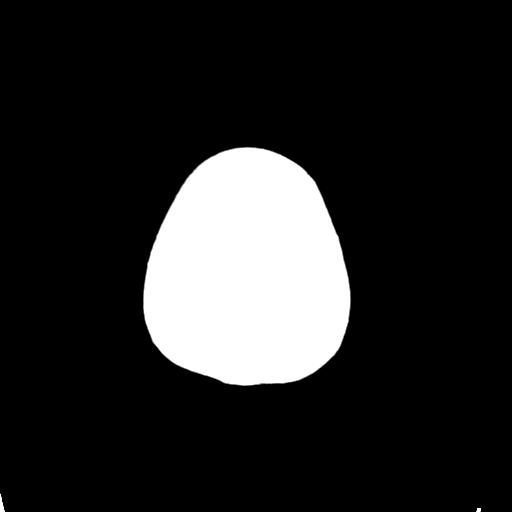

[Series 3: head bone · axial · 0.44mm/px · z∈[-102,-70]mm · 3 of 82 slices shown]
[im 9/82  bone]
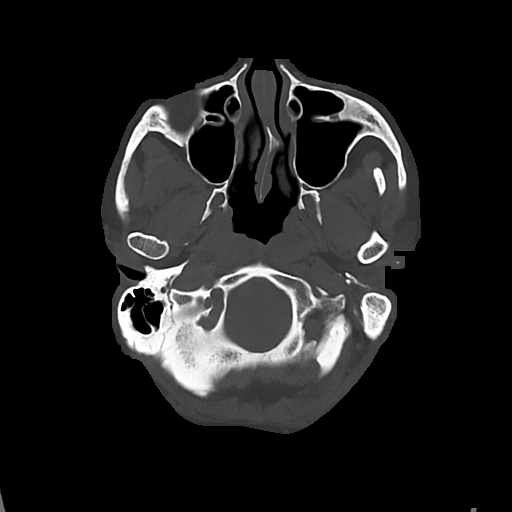
[im 17/82  bone]
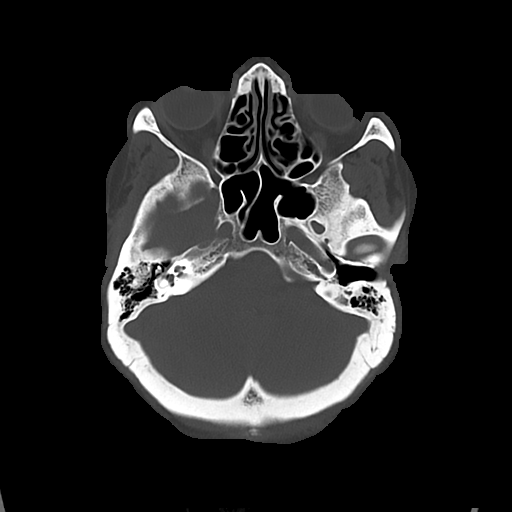
[im 25/82  bone]
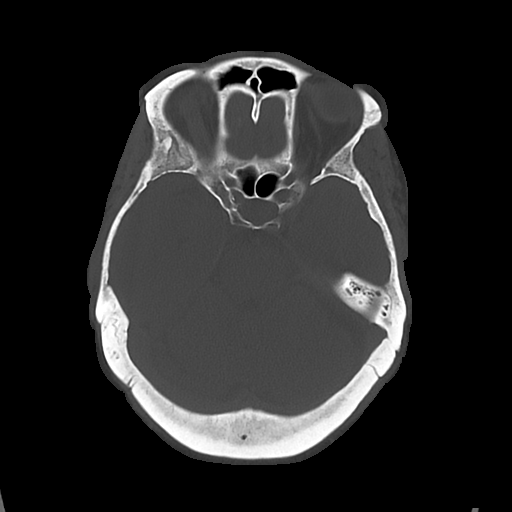

[Series 4: cor soft · coronal · 0.34mm/px · 3 of 72 slices shown]
[im 24/72  brain]
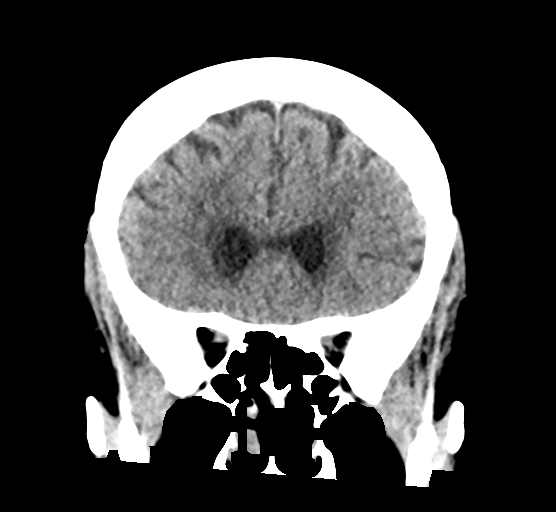
[im 32/72  brain]
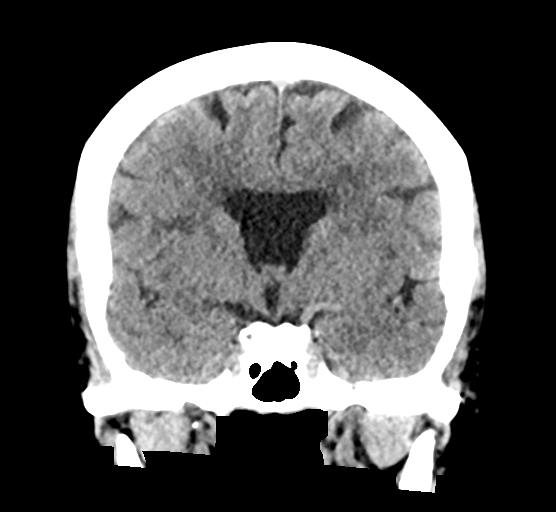
[im 40/72  brain]
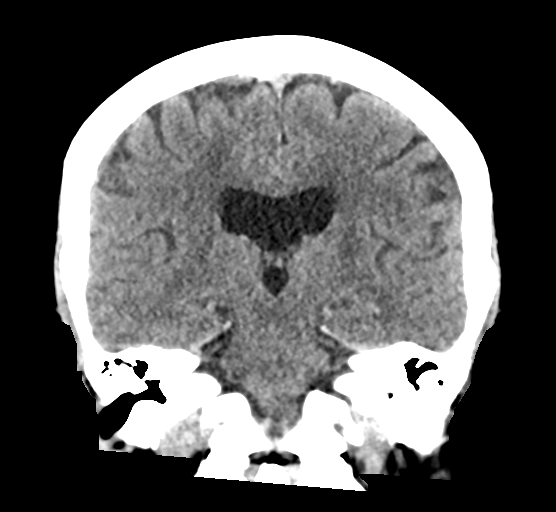

[Series 5: sag soft · sagittal · 0.35mm/px · 3 of 60 slices shown]
[im 20/60  brain]
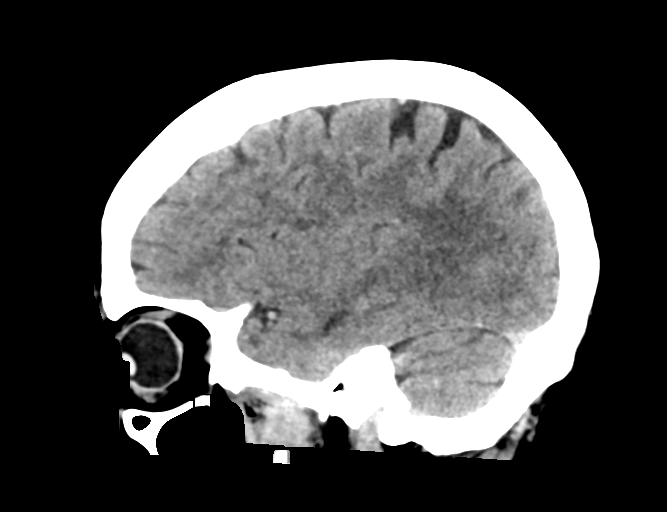
[im 30/60  brain]
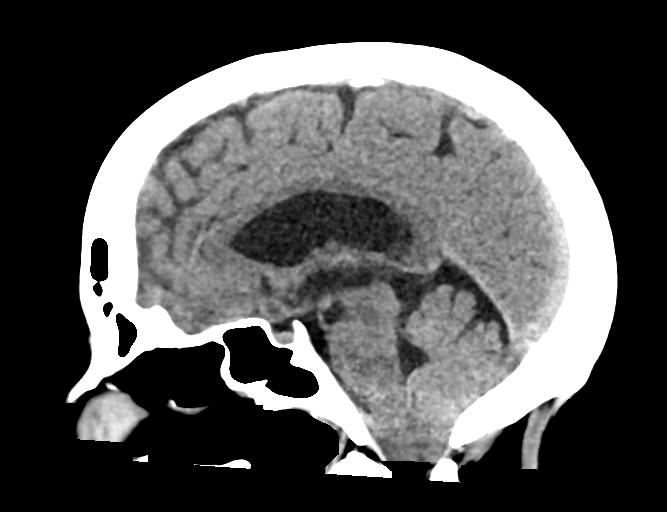
[im 40/60  brain]
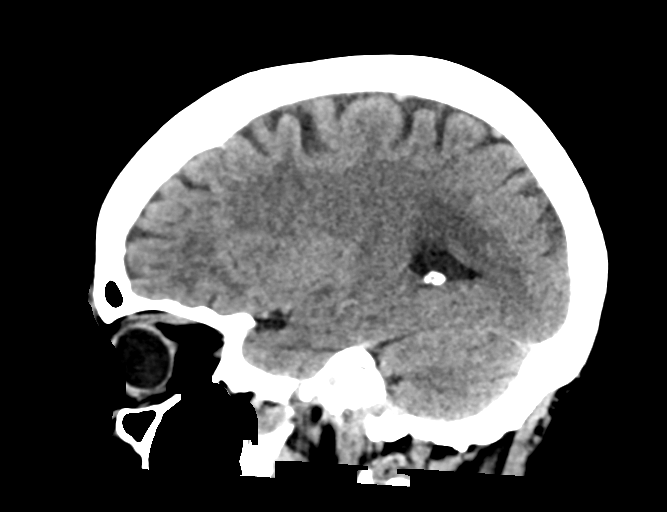

[16 of 47 positions shown; findings below may reference images not displayed]

FINDINGS: Brain: No acute intracranial findings are seen. There is no
significant dilation of ventricles. Septum pellucidum is not seen.
There is no shift of midline structures. There are no epidural or
subdural fluid collections. Cortical sulci are prominent. There is
decreased density in the periventricular and subcortical white
matter. Cerebellar tonsils are lower than usual in the position,
possibly suggesting Chiari 1 malformation.

Vascular: Unremarkable.

Skull: Unremarkable.

Sinuses/Orbits: No significant abnormality is seen.

Other: None.
IMPRESSION: No acute intracranial findings are seen in noncontrast CT brain.
Atrophy. Small-vessel disease. Cerebellar tonsils are lower than
usual in the position, possibly suggesting Chiari 1 malformation. No
significant interval changes are noted.

## 2021-08-06 MED ORDER — LISINOPRIL 10 MG PO TABS
30.0000 mg | ORAL_TABLET | Freq: Once | ORAL | Status: AC
  Administered 2021-08-06: 30 mg via ORAL
  Filled 2021-08-06: qty 3

## 2021-08-06 MED ORDER — DIPHENHYDRAMINE HCL 50 MG/ML IJ SOLN
25.0000 mg | Freq: Once | INTRAMUSCULAR | Status: AC
  Administered 2021-08-06: 25 mg via INTRAVENOUS
  Filled 2021-08-06: qty 1

## 2021-08-06 NOTE — Discharge Instructions (Signed)
Please take a full 1 tablet of your blood pressure medication every day and follow-up with your primary care doctor to discuss your blood pressure regimen. ? ?Check your blood pressure at home when you are in your normal environment and feeling well.  Keep track of these numbers to bring to your PCP. ?

## 2021-08-06 NOTE — ED Provider Notes (Signed)
? ?[facility] ?Provider Note ? ? ? Event Date/Time  ? First MD Initiated Contact with Patient 08/06/21 1318   ?  (approximate) ? ? ?History  ? ?Hypertension ? ? ?HPI ? ?Jordan Barron is a 71 y.o. female who presents to the ED for evaluation of Hypertension ?  ?I reviewed cardiology clinic visit from 2/6.  History of hypertension, diastolic dysfunction and SVT in the past. ?Admitted June of last year for vertigo, found to be peripheral in etiology with a negative stroke work-up.  Chiari I malformation is noted. ? ?Patient presents to the ED for evaluation of headache and congestion earlier this week.  She was seen at the neighboring walk-in clinic and due to her hypertensive blood pressure she was referred to [place]. ? ?She reports she feels fine and has no complaints.  She had a headache earlier this week, treated with Tylenol and with improvement.  Reports feeling congested in her maxillary sinuses and behind her ears bilaterally, attributing this to seasonal pollen and allergies that she feels every year. ? ?She went to the walk-in clinic to get checked out because of the allergies that she was feeling despite taking her home loratadine. ? ?She is prescribed carvedilol that she does not take and occasionally takes her lisinopril.  Did not take it yet today. ? ?Physical Exam  ? ?Triage Vital Signs: ?ED Triage Vitals  ?Enc Vitals Group  ?   BP 08/06/21 1309 (!) 231/81  ?   Pulse Rate 08/06/21 1309 78  ?   Resp 08/06/21 1309 20  ?   Temp 08/06/21 1309 98.3 ?F (36.8 ?C)  ?   Temp Source 08/06/21 1309 Oral  ?   SpO2 08/06/21 1309 97 %  ?   Weight 08/06/21 1313 220 lb (99.8 kg)  ?   Height 08/06/21 1313 5\' 10"  (1.778 m)  ?   Head Circumference --   ?   Peak Flow --   ?   Pain Score 08/06/21 1312 0  ?   Pain Loc --   ?   Pain Edu? --   ?   Excl. in GC? --   ? ? ?Most recent vital signs: ?Vitals:  ? 08/06/21 1309 08/06/21 1413  ?BP: (!) 231/81 (!) 220/78  ?Pulse: 78   ?Resp: 20   ?Temp: 98.3 ?F (36.8  ?C)   ?SpO2: 97%   ? ? ?General: Awake, no distress.  Ambulatory with normal gait.  Pleasant and conversational ?CV:  Good peripheral perfusion.  ?Resp:  Normal effort.  ?Abd:  No distention.  ?MSK:  No deformity noted.  ?Neuro:  No focal deficits appreciated. Cranial nerves II through XII intact ?5/5 strength and sensation in all 4 extremities ?Other:  Clear TMs bilaterally ? ? ?ED Results / Procedures / Treatments  ? ?Labs ?(all labs ordered are listed, but only abnormal results are displayed) ?Labs Reviewed  ?CBC WITH DIFFERENTIAL/PLATELET - Abnormal; Notable for the following components:  ?    Result Value  ? RBC 6.81 (*)   ? HCT 49.0 (*)   ? MCV 72.0 (*)   ? MCH 21.7 (*)   ? RDW 17.6 (*)   ? All other components within normal limits  ?COMPREHENSIVE METABOLIC PANEL - Abnormal; Notable for the following components:  ? Glucose, Bld 128 (*)   ? Total Protein 9.1 (*)   ? All other components within normal limits  ? ? ?EKG ?Sinus rhythm, rate of 74 bpm.  Normal axis.  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  No STEMI. ? ?RADIOLOGY ?CT head reviewed by me without evidence of acute intracranial pathology ? ?Official radiology report(s): ?CT HEAD WO CONTRAST ( ) ? ?Result Date: 08/06/2021 ?CLINICAL DATA:  Severe headaches, hypertension, dizziness EXAM: CT HEAD WITHOUT CONTRAST TECHNIQUE: Contiguous axial images were obtained from the base of the skull through the vertex without intravenous contrast. RADIATION DOSE REDUCTION: This exam was performed according to the departmental dose-optimization program which includes automated exposure control, adjustment of the mA and/or kV according to patient size and/or use of iterative reconstruction technique. COMPARISON:  05/10/2015 FINDINGS: Brain: No acute intracranial findings are seen. There is no significant dilation of ventricles. Septum pellucidum is not seen. There is no shift of midline structures. There are no epidural or subdural fluid  collections. Cortical sulci are prominent. There is decreased density in the periventricular and subcortical white matter. Cerebellar tonsils are lower than usual in the position, possibly suggesting Chiari 1 malformation. Vascular: Unremarkable. Skull: Unremarkable. Sinuses/Orbits: No significant abnormality is seen. Other: None. IMPRESSION: No acute intracranial findings are seen in noncontrast CT brain. Atrophy. Small-vessel disease. Cerebellar tonsils are lower than usual in the position, possibly suggesting Chiari 1 malformation. No significant interval changes are noted. Electronically Signed   By: Ernie Avena M.D.   On: 08/06/2021 14:39   ? ?PROCEDURES and INTERVENTIONS: ? ?.1-3 Lead EKG Interpretation ?Performed by: Delton Prairie, MD ?Authorized by: Delton Prairie, MD  ? ?  Interpretation: normal   ?  ECG rate:  70 ?  ECG rate assessment: normal   ?  Rhythm: sinus rhythm   ?  Ectopy: none   ?  Conduction: normal   ? ?Medications  ?lisinopril (ZESTRIL) tablet 30 mg (30 mg Oral Given 08/06/21 1413)  ?diphenhydrAMINE (BENADRYL) injection 25 mg (25 mg Intravenous Given 08/06/21 1413)  ? ? ? ?IMPRESSION / MDM / ASSESSMENT AND PLAN / ED COURSE  ?I reviewed the triage vital signs and the nursing notes. ? ?71 year old female with long documented history of poorly controlled hypertension presents to the ED with asymptomatic hypertension suitable for outpatient management.  She is hypertensive with systolics in the 220s-230s initially, but asymptomatic.  Look systemically well without evidence of neurologic or vascular deficits.  No evidence of cardiac ischemia, endorgan damage.  CBC at baseline and metabolic panel without pathology.  CT scan of the head without evidence of ICH or CVA.  I urged the patient to actually take her antihypertensive regimen at home and follow-up with her PCP.  Provided Benadryl due to her feeling congested with seasonal allergies.  She has clear TMs and no evidence of AOM, upper airway  obstruction or more severe pathology.  No barriers to outpatient management. ? ?Clinical Course as of 08/06/21 1447  ?Sat Aug 06, 2021  ?1443 Reassessed.  Continues to feel fine and have no complaints.  We discussed reassuring work-up.  She is thankful. [DS]  ?  ?Clinical Course User Index ?[DS] Delton Prairie, MD  ? ? ? ?FINAL CLINICAL IMPRESSION(S) / ED DIAGNOSES  ? ?Final diagnoses:  ?Asymptomatic hypertension  ? ? ? ?Rx / DC Orders  ? ?ED Discharge Orders   ? ? None  ? ?  ? ? ? ?Note:  This document was prepared using Dragon voice recognition software and may include unintentional dictation errors. ?  ?Delton Prairie, MD ?08/06/21 1449 ? ?

## 2021-08-06 NOTE — Discharge Instructions (Addendum)
Your blood pressures is dangerously elevated x2 readings with the highest 242/90. ?Given that you are having a headache, dizziness, and pressure in both of your ears you warrant further evaluation in the setting of the emergency department.  Your EKG has mild changes compared to the last one you had a few months back and this also warrants further work-up. ?Your headache pain and pressure and ear pressure may be related to your dangerously high blood pressure reading. ?

## 2021-08-06 NOTE — ED Notes (Signed)
Patient is being discharged from the Urgent Care and sent to the Emergency Department via personal vehicle (daughter) . Per Jerrilyn Cairo, NP, patient is in need of higher level of care due to Hypertension with symptoms. Patient is aware and verbalizes understanding of plan of care.  ?Vitals:  ? 08/06/21 1202 08/06/21 1210  ?BP:  (!) 242/90  ?Pulse: 77   ?Resp: 18   ?Temp: 98.8 ?F (37.1 ?C)   ?SpO2: 97%   ?  ?

## 2021-08-06 NOTE — ED Notes (Signed)
Pt A&O, IV removed, pt given discharge instructions, pt ambulating with steady gait. 

## 2021-08-06 NOTE — ED Provider Notes (Signed)
?[facility] ? ? ? ?CSN: [id number] ?Arrival date & time: 08/06/21  1140 ? ? ?  ? ?History   ?Chief Complaint ?Chief Complaint  ?Patient presents with  ? Headache  ? Otalgia  ? SInus Pressure   ? ? ?HPI ?Jordan Barron is a 71 y.o. female.  ? ?HPI ?Patient with a history of diastolic dysfunction, hypertension, paroxysmal SVT presents today with headache which she reports as generalized head pressure and pressure in her inner ears which she thought may be related to sinuses presents today with a blood pressure of 242/90.  Blood pressure was rechecked twice and both readings were in accordance with 242/90.  Patient reports she has not taken her blood pressure medicine today.  Patient reports headache pressure and pain has been present for over a week.  She endorses dizziness and feeling generally unwell.  She has not had a fever.  She is followed by cardiology and was last seen in February. ?Past Medical History:  ?Diagnosis Date  ? Diastolic dysfunction   ? a. 09/2014 Echo: EF 65%, no rwma; b. 11/2019 Echo: EF 55-60%, no rwam, GrII DD. Nl RV size/fxn. Mild AoV sclerosis w/o stenosis.  ? Hypertension   ? Paroxysmal SVT (supraventricular tachycardia) (HCC)   ? ? ?Patient Active Problem List  ? Diagnosis Date Noted  ? Benign paroxysmal positional vertigo   ? Chiari malformation type I (HCC)   ? Impaired fasting glucose   ? Vertigo 09/19/2020  ? PSVT (paroxysmal supraventricular tachycardia) (HCC) 09/10/2014  ? Essential hypertension 09/10/2014  ? ? ?Past Surgical History:  ?Procedure Laterality Date  ? ABDOMINAL HYSTERECTOMY    ? ? ?OB History   ?No obstetric history on file. ?  ? ? ? ?Home Medications   ? ?Prior to Admission medications   ?Medication Sig Start Date End Date Taking? Authorizing Provider  ?lisinopril (ZESTRIL) 30 MG tablet Take 0.5 tablets (15 mg total) by mouth daily. 05/19/21  Yes Creig Hines, NP  ?Multiple Vitamin (MULTIVITAMIN) capsule Take 1 capsule by mouth daily.   Yes  [provider]  ?ascorbic acid (VITAMIN C) 500 MG tablet Take 500 mg by mouth daily.    [provider]  ?carvedilol (COREG) 3.125 MG tablet Take 1 tablet (3.125 mg total) by mouth 2 (two) times daily. 05/16/21 08/14/21  Swinyer, Zachary George, NP  ?fluticasone (FLONASE) 50 MCG/ACT nasal spray Place 2 sprays into both nostrils daily as needed for allergies.    [provider]  ?metFORMIN (GLUCOPHAGE) 500 MG tablet metformin 500 mg tablet ? TAKE 1 TABLET BY MOUTH EVERY DAY    [provider]  ?metoprolol tartrate (LOPRESSOR) 25 MG tablet Take 25 mg by mouth 2 (two) times daily as needed (SVT).    [provider]  ?pantoprazole (PROTONIX) 20 MG tablet Take 20 mg by mouth 2 (two) times daily.    [provider]  ?rosuvastatin (CRESTOR) 10 MG tablet rosuvastatin 10 mg tablet ? TAKE 1 TABLET BY MOUTH EVERY DAY    [provider]  ? ? ?Family History ?Family History  ?Problem Relation Age of Onset  ? Other Mother   ?     pacemaker  ? ? ?Social History ?Social History  ? ?Tobacco Use  ? Smoking status: Never  ? Smokeless tobacco: Never  ?Vaping Use  ? Vaping Use: Never used  ?Substance Use Topics  ? Alcohol use: No  ? Drug use: No  ? ? ? ?Allergies   ?Patient has  no known allergies. ? ? ?Review of Systems ?Review of Systems ?Pertinent negatives listed in HPI  ?Physical Exam ?Triage Vital Signs ?ED Triage Vitals  ?Enc Vitals Group  ?   BP 08/06/21 1210 (!) 242/90  ?   Pulse Rate 08/06/21 1202 77  ?   Resp 08/06/21 1202 18  ?   Temp 08/06/21 1202 98.8 ?F (37.1 ?C)  ?   Temp Source 08/06/21 1202 Oral  ?   SpO2 08/06/21 1202 97 %  ?   Weight --   ?   Height --   ?   Head Circumference --   ?   Peak Flow --   ?   Pain Score 08/06/21 1204 0  ?   Pain Loc --   ?   Pain Edu? --   ?   Excl. in GC? --   ? ?No data found. ? ?Updated Vital Signs ?BP (!) 242/90 (BP Location: Left Arm)   Pulse 77   Temp 98.8 ?F (37.1 ?C) (Oral)   Resp 18   SpO2 97%  ? ?Visual Acuity ?Right Eye  Distance:   ?Left Eye Distance:   ?Bilateral Distance:   ? ?Right Eye Near:   ?Left Eye Near:    ?Bilateral Near:    ? ?Physical Exam ?Constitutional:   ?   Appearance: She is obese.  ?HENT:  ?   Head: Normocephalic and atraumatic.  ?Eyes:  ?   Extraocular Movements: Extraocular movements intact.  ?   Pupils: Pupils are equal, round, and reactive to light.  ?Cardiovascular:  ?   Rate and Rhythm: Normal rate. Rhythm irregular.  ?   Heart sounds: Murmur heard.  ?Pulmonary:  ?   Effort: Pulmonary effort is normal.  ?   Breath sounds: Normal breath sounds.  ?Musculoskeletal:  ?   Cervical back: Normal range of motion and neck supple.  ?Skin: ?   Capillary Refill: Capillary refill takes less than 2 seconds.  ?Neurological:  ?   Mental Status: She is alert.  ?   GCS: GCS eye subscore is 4. GCS verbal subscore is 5. GCS motor subscore is 6.  ?   Coordination: Coordination abnormal.  ?   Gait: Gait abnormal.  ?   Comments: Balance impaired intermittently with antalgic gait  ?Psychiatric:     ?   Mood and Affect: Mood normal.     ?   Speech: Speech normal.     ?   Behavior: Behavior normal.  ? ? ? ?UC Treatments / Results  ?Labs ?(all labs ordered are listed, but only abnormal results are displayed) ?Labs Reviewed  ?POCT FASTING CBG [facility] MANUAL ENTRY  ? ? ?EKG ?EKG SR w/ occasional PVC, incomplete RBBB, left anterior block, and LVH, changed compared to last ECG tracing 04/29/21 ? ?Radiology ?No results found. ? ?Procedures ?Procedures (including critical care time) ? ?Medications Ordered in UC ?Medications - No data to display ? ?Initial Impression / Assessment and Plan / UC Course  ?I have reviewed the triage vital signs and the nursing notes. ? ?Pertinent labs & imaging results that were available during my care of the patient were reviewed by me and considered in my medical decision making (see chart for details). ? ?  ?Given patient's hypertensive urgency, head pressure and headache along with dizziness patient is being  directed to go to the ER.  Given patient is dizzy she did phone her daughter to come and pick her up opposed to being transported by  EMS.  Blood glucose was also checked is stable at 131.  Patient also has had some changes in her ECG compared to the last ECG traced on 04/29/21, which further warrants work-up in the setting of the ER. ?Final Clinical Impressions(s) / UC Diagnoses  ? ?Final diagnoses:  ?Hypertensive urgency  ?Generalized headache  ? ? ? ?Discharge Instructions   ? ?  ?Your blood pressures is dangerously elevated x2 readings with the highest 242/90. ?Given that you are having a headache and pressure in both of your ears you warrant further evaluation in the setting of the emergency department.  Your headache pain and pressure and ear pressure may be related to your dangerously high blood pressure reading. ? ? ?ED Prescriptions   ?None ?  ? ?PDMP not reviewed this encounter. ?  ?Bing Neighbors, FNP ?08/06/21 1253 ? ?

## 2021-08-06 NOTE — ED Triage Notes (Signed)
Pt states she was seen at [facility] for HA and dizziness for over a week- pt had an elevated BP of 240's so they sent her here to be seen- pt states her bp usually gets high when she goes to the dr but not his high ?

## 2021-08-06 NOTE — ED Triage Notes (Signed)
Pt presents with sinus pressure, runny nose, bilateral ear pain and HA x 1 week.  ?

## 2021-08-16 NOTE — Progress Notes (Deleted)
Cardiology Office Note    Date:  08/16/2021   ID:  Jordan Barron, DOB Jan 15, 1951, MRN [id number]  PCP:  Lindaann Pascal, PA-C  Cardiologist:  Yvonne Kendall, MD  Electrophysiologist:  None   Chief Complaint: ***  History of Present Illness:   Jordan Barron is a 71 y.o. female with history of ***  ***   Labs independently reviewed: 07/2021 - potassium 4.9, BUN 13, serum creatinine 0.64, albumin 4.2, AST/ALT normal, Hgb 14.8, PLT 247 05/2021 - TSH normal, magnesium 2.2  Past Medical History:  Diagnosis Date   Diastolic dysfunction    a. 09/2014 Echo: EF 65%, no rwma; b. 11/2019 Echo: EF 55-60%, no rwam, GrII DD. Nl RV size/fxn. Mild AoV sclerosis w/o stenosis.   Hypertension    Paroxysmal SVT (supraventricular tachycardia) (HCC)     Past Surgical History:  Procedure Laterality Date   ABDOMINAL HYSTERECTOMY      Current Medications: No outpatient medications have been marked as taking for the 08/17/21 encounter (Appointment) with Sondra Barges, PA-C.    Allergies:   Patient has no known allergies.   Social History   Socioeconomic History   Marital status: Single    Spouse name: Not on file   Number of children: Not on file   Years of education: Not on file   Highest education level: Not on file  Occupational History   Not on file  Tobacco Use   Smoking status: Never   Smokeless tobacco: Never  Vaping Use   Vaping Use: Never used  Substance and Sexual Activity   Alcohol use: No   Drug use: No   Sexual activity: Not on file  Other Topics Concern   Not on file  Social History Narrative   Not on file   Social Determinants of Health   Financial Resource Strain: Not on file  Food Insecurity: Not on file  Transportation Needs: Not on file  Physical Activity: Not on file  Stress: Not on file  Social Connections: Not on file     Family History:  The patient's family history includes Other in her mother.  ROS:   ROS   EKGs/Labs/Other Studies Reviewed:     Studies reviewed were summarized above. The additional studies were reviewed today:  2D echo 05/12/2021: 1. Left ventricular ejection fraction, by estimation, is 60 to 65%. The  left ventricle has normal function. The left ventricle has no regional  wall motion abnormalities. Left ventricular diastolic parameters are  consistent with Grade I diastolic  dysfunction (impaired relaxation).   2. Right ventricular systolic function is normal. The right ventricular  size is normal. Tricuspid regurgitation signal is inadequate for assessing  PA pressure.   3. Left atrial size was mildly dilated.   4. The mitral valve is normal in structure. Mild mitral valve  regurgitation. No evidence of mitral stenosis.   5. The aortic valve is normal in structure. Aortic valve regurgitation is  not visualized. Aortic valve sclerosis is present, with no evidence of  aortic valve stenosis.   6. The inferior vena cava is normal in size with greater than 50%  respiratory variability, suggesting right atrial pressure of 3 mmHg.   7. PVCs noted  __________  2D echo 11/14/2019: 1. Left ventricular ejection fraction, by estimation, is 55 to 60%. The  left ventricle has normal function. The left ventricle has no regional  wall motion abnormalities. Left ventricular diastolic parameters are  consistent with Grade II diastolic  dysfunction (pseudonormalization).  2. Right ventricular systolic function is normal. The right ventricular  size is normal.   3. The mitral valve is normal in structure. No evidence of mitral valve  regurgitation. No evidence of mitral stenosis.   4. The aortic valve is tricuspid. Aortic valve regurgitation is not  visualized. Mild aortic valve sclerosis is present, with no evidence of  aortic valve stenosis.   5. The inferior vena cava is normal in size with greater than 50%  respiratory variability, suggesting right atrial pressure of 3 mmHg. __________  2D echo 09/17/2014: - Left  ventricle: The cavity size was normal. Wall thickness was    normal. The estimated ejection fraction was 65%. Wall motion was    normal; there were no regional wall motion abnormalities.  - Right ventricle: The cavity size was normal. Systolic function    was normal.    EKG:  EKG is ordered today.  The EKG ordered today demonstrates ***  Recent Labs: 05/12/2021: Magnesium 2.2; TSH 2.340 08/06/2021: ALT 19; BUN 13; Creatinine, Ser 0.64; Hemoglobin 14.8; Platelets 247; Potassium 4.9; Sodium 137  Recent Lipid Panel No results found for: CHOL, TRIG, HDL, CHOLHDL, VLDL, LDLCALC, LDLDIRECT  PHYSICAL EXAM:    VS:  There were no vitals taken for this visit.  BMI: There is no height or weight on file to calculate BMI.  Physical Exam  Wt Readings from Last 3 Encounters:  08/06/21 220 lb (99.8 kg)  05/16/21 215 lb 6 oz (97.7 kg)  04/29/21 214 lb (97.1 kg)     ASSESSMENT & PLAN:   ***   {Are you ordering a CV Procedure (e.g. stress test, cath, DCCV, TEE, etc)?   Press F2        :[id number]}     Disposition: F/u with Dr. Okey Dupre or an APP in ***.   Medication Adjustments/Labs and Tests Ordered: Current medicines are reviewed at length with the patient today.  Concerns regarding medicines are outlined above. Medication changes, Labs and Tests ordered today are summarized above and listed in the Patient Instructions accessible in Encounters.   Signed, Eula Listen, PA-C 08/16/2021 8:05 AM     [facility] [place] [address] [place], [place] [address] [phone]

## 2021-08-17 ENCOUNTER — Ambulatory Visit: Payer: Medicare Other | Admitting: Physician Assistant

## 2022-02-11 ENCOUNTER — Other Ambulatory Visit: Payer: Self-pay

## 2022-02-11 ENCOUNTER — Ambulatory Visit (HOSPITAL_COMMUNITY)
Admission: EM | Admit: 2022-02-11 | Discharge: 2022-02-11 | Disposition: A | Discharge status: Home / Self Care | Payer: Medicare Other | Attending: Family Medicine | Admitting: Family Medicine

## 2022-02-11 ENCOUNTER — Encounter (HOSPITAL_COMMUNITY): Payer: Self-pay | Admitting: *Deleted

## 2022-02-11 DIAGNOSIS — G8929 Other chronic pain: Secondary | ICD-10-CM | POA: Insufficient documentation

## 2022-02-11 DIAGNOSIS — M545 Low back pain, unspecified: Secondary | ICD-10-CM | POA: Diagnosis not present

## 2022-02-11 DIAGNOSIS — E1165 Type 2 diabetes mellitus with hyperglycemia: Secondary | ICD-10-CM | POA: Diagnosis present

## 2022-02-11 DIAGNOSIS — R5383 Other fatigue: Secondary | ICD-10-CM | POA: Insufficient documentation

## 2022-02-11 DIAGNOSIS — R42 Dizziness and giddiness: Secondary | ICD-10-CM | POA: Diagnosis not present

## 2022-02-11 DIAGNOSIS — E119 Type 2 diabetes mellitus without complications: Secondary | ICD-10-CM

## 2022-02-11 LAB — POCT URINALYSIS DIPSTICK, ED / UC
Bilirubin Urine: NEGATIVE
Glucose, UA: NEGATIVE mg/dL
Ketones, ur: NEGATIVE mg/dL
Nitrite: NEGATIVE
Protein, ur: NEGATIVE mg/dL
Specific Gravity, Urine: <=1.005 (ref 1.005–1.030)
Urobilinogen, UA: 0.2 mg/dL (ref 0.0–1.0)
pH: 6 (ref 5.0–8.0)

## 2022-02-11 LAB — CBC WITH DIFFERENTIAL/PLATELET
Abs Immature Granulocytes: 0.04 10*3/uL (ref 0.00–0.07)
Basophils Absolute: 0.1 10*3/uL (ref 0.0–0.1)
Basophils Relative: 1 %
Eosinophils Absolute: 0 10*3/uL (ref 0.0–0.5)
Eosinophils Relative: 0 %
HCT: 46 % (ref 36.0–46.0)
Hemoglobin: 14.8 g/dL (ref 12.0–15.0)
Immature Granulocytes: 1 %
Lymphocytes Relative: 24 %
Lymphs Abs: 2.1 10*3/uL (ref 0.7–4.0)
MCH: 22.7 pg — ABNORMAL LOW (ref 26.0–34.0)
MCHC: 32.2 g/dL (ref 30.0–36.0)
MCV: 70.4 fL — ABNORMAL LOW (ref 80.0–100.0)
Monocytes Absolute: 0.5 10*3/uL (ref 0.1–1.0)
Monocytes Relative: 6 %
Neutro Abs: 5.9 10*3/uL (ref 1.7–7.7)
Neutrophils Relative %: 68 %
Platelets: 330 10*3/uL (ref 150–400)
RBC: 6.53 MIL/uL — ABNORMAL HIGH (ref 3.87–5.11)
RDW: 17.3 % — ABNORMAL HIGH (ref 11.5–15.5)
WBC: 8.6 10*3/uL (ref 4.0–10.5)
nRBC: 0 % (ref 0.0–0.2)

## 2022-02-11 LAB — HEMOGLOBIN A1C
Hgb A1c MFr Bld: 6.8 % — ABNORMAL HIGH (ref 4.8–5.6)
Mean Plasma Glucose: 148.46 mg/dL

## 2022-02-11 LAB — COMPREHENSIVE METABOLIC PANEL
ALT: 22 U/L (ref 0–44)
AST: 21 U/L (ref 15–41)
Albumin: 3.9 g/dL (ref 3.5–5.0)
Alkaline Phosphatase: 98 U/L (ref 38–126)
Anion gap: 10 (ref 5–15)
BUN: 13 mg/dL (ref 8–23)
CO2: 22 mmol/L (ref 22–32)
Calcium: 9.5 mg/dL (ref 8.9–10.3)
Chloride: 103 mmol/L (ref 98–111)
Creatinine, Ser: 0.75 mg/dL (ref 0.44–1.00)
GFR, Estimated: >60 mL/min (ref >60)
Glucose, Bld: 164 mg/dL — ABNORMAL HIGH (ref 70–99)
Potassium: 4 mmol/L (ref 3.5–5.1)
Sodium: 135 mmol/L (ref 135–145)
Total Bilirubin: 0.6 mg/dL (ref 0.3–1.2)
Total Protein: 8.4 g/dL — ABNORMAL HIGH (ref 6.5–8.1)

## 2022-02-11 LAB — CBG MONITORING, ED: Glucose-Capillary: 160 mg/dL — ABNORMAL HIGH (ref 70–99)

## 2022-02-11 LAB — TSH: TSH: 2.239 u[IU]/mL (ref 0.350–4.500)

## 2022-02-11 MED ORDER — PANTOPRAZOLE SODIUM 20 MG PO TBEC: 20.0000 mg | DELAYED_RELEASE_TABLET | Freq: Two times a day (BID) | ORAL | 1 refills | Status: AC

## 2022-02-11 MED ORDER — METFORMIN HCL 500 MG PO TABS: 500.0000 mg | ORAL_TABLET | Freq: Two times a day (BID) | ORAL | 1 refills | Status: DC

## 2022-02-11 NOTE — ED Triage Notes (Signed)
Pt reports congestion ,Chronic back ache which is worse. For one month has felt dizzy off and on.

## 2022-02-11 NOTE — Discharge Instructions (Signed)
You have had labs (blood work and a urine culture) sent today. We will call you with any significant abnormalities or if there is need to begin or change treatment or pursue further follow up.  You may also review your test results online through MyChart. If you do not have a MyChart account, instructions to sign up should be on your discharge paperwork.  

## 2022-02-13 ENCOUNTER — Telehealth (HOSPITAL_COMMUNITY): Payer: Self-pay | Admitting: Emergency Medicine

## 2022-02-13 LAB — URINE CULTURE: Culture: 20000 — AB

## 2022-02-13 MED ORDER — SULFAMETHOXAZOLE-TRIMETHOPRIM 800-160 MG PO TABS: 1.0000 | ORAL_TABLET | Freq: Two times a day (BID) | ORAL | 0 refills | Status: AC

## 2022-02-13 NOTE — ED Provider Notes (Signed)
[place]   [id number] 02/11/22 Arrival Time: 73  ASSESSMENT & PLAN:  1. New onset type 2 diabetes mellitus ([place])   2. Intermittent lightheadedness   3. Other fatigue   4. Chronic bilateral low back pain without sciatica    No s/s of DKA.  Discussed abnormal labs as below: Labs Reviewed  CBC WITH DIFFERENTIAL/PLATELET - Abnormal; Notable for the following components:   RBC 6.53 (*)    MCV 70.4 (*)    MCH 22.7 (*)    RDW 17.3 (*)    All other components within normal limits  COMPREHENSIVE METABOLIC PANEL - Abnormal; Notable for the following components:   Glucose, Bld 164 (*)    Total Protein 8.4 (*)    All other components within normal limits  HEMOGLOBIN A1C - Abnormal; Notable for the following components:   Hgb A1c MFr Bld 6.8 (*)    All other components within normal limits  POCT URINALYSIS DIPSTICK, ED / UC - Abnormal; Notable for the following components:   Hgb urine dipstick TRACE (*)    Leukocytes,Ua TRACE (*)    All other components within normal limits  CBG MONITORING, ED - Abnormal; Notable for the following components:   Glucose-Capillary 160 (*)    All other components within normal limits     Urine culture and TSH pending.  Normal neurologic exam. No suspicion for ICH or SAH. No indication for neurodiagnostic imaging at this time. Elevated blood sugars likely causing her current symptoms except for low back discomfort which is likely muscular. Discussed.  Begin: Meds ordered this encounter  Medications   metFORMIN (GLUCOPHAGE) 500 MG tablet    Sig: Take 1 tablet (500 mg total) by mouth 2 (two) times daily with a meal.    Dispense:  60 tablet    Refill:  1   pantoprazole (PROTONIX) 20 MG tablet    Sig: Take 1 tablet (20 mg total) by mouth 2 (two) times daily.    Dispense:  60 tablet    Refill:  1   Refilled Protonix at pt request.   Follow-up Information     Schedule an appointment as soon as possible for a visit  with Long, Scott,  PA-C.   Specialty: Physician Assistant Contact information: [address] [place] [address] [place].   Specialty: Emergency Medicine Why: If symptoms worsen in any way. Contact information: [address] [id number] [place] [phone]                 Reviewed expectations re: course of current medical issues. Questions answered. Outlined signs and symptoms indicating need for more acute intervention. Patient verbalized understanding. After Visit Summary given.   SUBJECTIVE:  Jordan Barron is a 71 y.o. female who reports feeling lightheaded at times; on/off over past month. No specific vertigo. No headaches. Occas blurry vision. Experiencing symptoms more frequently over past week or two. No specific aggravating or alleviating factors reported. Denies gait problems, memory problems, paresthesia, seizures, speech problems, tremors, and weakness as well as otalgia and tinnitus. Recent infections: none. Head trauma: denied. Noise exposure: denied. No associated SOB, CP, or palpatations reported. Recent travel: none. Reports normal bowel/bladder habits. No tx PTA. No new medications. Requests refill of Protonix.  Also with acute on chronic LBP. Recent flare; "just sore", esp with certain movements; does not radiate. Normal bowel/bladder habits.  Social History  Substance and Sexual Activity  Alcohol Use No   Social History   Tobacco Use  Smoking Status Never  Smokeless Tobacco Never   Denies recreational drug use.   OBJECTIVE:  Vitals:   02/11/22 1030  Pulse: 63  Resp: 20  Temp: 98.7 F (37.1 C)  SpO2: 97%    General appearance: alert; no distress Eyes: PERRLA; EOMI; conjunctiva normal HENT: normocephalic; atraumatic; TMs normal; nasal mucosa normal; oral mucosa normal Neck: supple with FROM Lungs: clear to auscultation  bilaterally Heart: regular rate and rhythm Abdomen: soft, non-tender; bowel sounds normal Extremities: no cyanosis or edema; symmetrical with no gross deformities Skin: warm and dry Neurologic: normal gait; DTR's normal and symmetric; CN 2-12 grossly intact; normal extremity strength and sensation Psychological: alert and cooperative; normal mood and affect   No Known Allergies  Past Medical History:  Diagnosis Date   Diastolic dysfunction    a. 09/2014 Echo: EF 65%, no rwma; b. 11/2019 Echo: EF 55-60%, no rwam, GrII DD. Nl RV size/fxn. Mild AoV sclerosis w/o stenosis.   Hypertension    Paroxysmal SVT (supraventricular tachycardia)    Social History   Socioeconomic History   Marital status: Single    Spouse name: Not on file   Number of children: Not on file   Years of education: Not on file   Highest education level: Not on file  Occupational History   Not on file  Tobacco Use   Smoking status: Never   Smokeless tobacco: Never  Vaping Use   Vaping Use: Never used  Substance and Sexual Activity   Alcohol use: No   Drug use: No   Sexual activity: Not on file  Other Topics Concern   Not on file  Social History Narrative   Not on file   Social Determinants of Health   Financial Resource Strain: Not on file  Food Insecurity: Not on file  Transportation Needs: Not on file  Physical Activity: Not on file  Stress: Not on file  Social Connections: Not on file  Intimate Partner Violence: Not on file   Family History  Problem Relation Age of Onset   Other Mother        pacemaker   Past Surgical History:  Procedure Laterality Date   ABDOMINAL HYSTERECTOMY         Mardella Layman, MD 02/13/22 1102

## 2022-06-11 ENCOUNTER — Ambulatory Visit
Admission: EM | Admit: 2022-06-11 | Discharge: 2022-06-11 | Disposition: A | Discharge status: Home / Self Care | Payer: Medicare Other

## 2022-06-11 ENCOUNTER — Encounter: Payer: Self-pay | Admitting: Emergency Medicine

## 2022-06-11 DIAGNOSIS — B9689 Other specified bacterial agents as the cause of diseases classified elsewhere: Secondary | ICD-10-CM | POA: Diagnosis not present

## 2022-06-11 DIAGNOSIS — J019 Acute sinusitis, unspecified: Secondary | ICD-10-CM

## 2022-06-11 MED ORDER — AMOXICILLIN-POT CLAVULANATE 875-125 MG PO TABS: 1.0000 | ORAL_TABLET | Freq: Two times a day (BID) | ORAL | 0 refills | Status: DC

## 2022-06-11 NOTE — ED Triage Notes (Signed)
Pt presents with a headache, sinus pressure, dizziness, chills and back pain x 1 month. Pt has not seen anyone for these symptoms.

## 2022-06-11 NOTE — Discharge Instructions (Signed)
Follow up here or with your primary care provider if your symptoms are worsening or not improving with treatment.          

## 2022-06-11 NOTE — ED Provider Notes (Signed)
[facility]    CSN: [id number] Arrival date & time: 06/11/22  0957      History   Chief Complaint Chief Complaint  Patient presents with   Headache   Sinus pressure    Dizziness   Chills   Back Pain    HPI SHEARON BOLER is a 72 y.o. female.    Headache Associated symptoms: back pain and dizziness   Dizziness Associated symptoms: headaches   Back Pain Associated symptoms: headaches     Presents with symptoms of headache, sinus pressure and pain, dizziness, chills, x 1 month.  She also reports back pain.  She says the pressure in her face is across her eyes and forehead.  Also reports pain in both ears, sputum production with mild cough.  Denies documented fever or chills.  She has elevated blood pressure in clinic today and states she did not take her medication because she thought it would interfere with diagnosing her symptoms.  Past Medical History:  Diagnosis Date   Diastolic dysfunction    a. 09/2014 Echo: EF 65%, no rwma; b. 11/2019 Echo: EF 55-60%, no rwam, GrII DD. Nl RV size/fxn. Mild AoV sclerosis w/o stenosis.   Hypertension    Paroxysmal SVT (supraventricular tachycardia)     Patient Active Problem List   Diagnosis Date Noted   Benign paroxysmal positional vertigo    Chiari malformation type I ([place])    Impaired fasting glucose    Vertigo 09/19/2020   PSVT (paroxysmal supraventricular tachycardia) 09/10/2014   Essential hypertension 09/10/2014    Past Surgical History:  Procedure Laterality Date   ABDOMINAL HYSTERECTOMY      OB History   No obstetric history on file.      Home Medications    Prior to Admission medications   Medication Sig Start Date End Date Taking? Authorizing Provider  lisinopril (ZESTRIL) 30 MG tablet Take 0.5 tablets (15 mg total) by mouth daily. 05/19/21  Yes Theora Gianotti, NP  loratadine (CLARITIN) 10 MG tablet Take by mouth. 11/16/15  Yes [provider]  pantoprazole (PROTONIX) 20 MG  tablet Take 1 tablet (20 mg total) by mouth 2 (two) times daily. 02/11/22  Yes Hagler, Aaron Edelman, MD  ascorbic acid (VITAMIN C) 500 MG tablet Take 500 mg by mouth daily.    [provider]  carvedilol (COREG) 3.125 MG tablet Take 1 tablet (3.125 mg total) by mouth 2 (two) times daily. 05/16/21 08/14/21  Swinyer, Lanice Schwab, NP  fluticasone (FLONASE) 50 MCG/ACT nasal spray Place 2 sprays into both nostrils daily as needed for allergies.    [provider]  metFORMIN (GLUCOPHAGE) 500 MG tablet Take 1 tablet (500 mg total) by mouth 2 (two) times daily with a meal. 02/11/22   Vanessa Kick, MD  metoprolol tartrate (LOPRESSOR) 25 MG tablet Take 25 mg by mouth 2 (two) times daily as needed (SVT).    [provider]  Multiple Vitamin (MULTIVITAMIN) capsule Take 1 capsule by mouth daily.    [provider]  rosuvastatin (CRESTOR) 10 MG tablet rosuvastatin 10 mg tablet  TAKE 1 TABLET BY MOUTH EVERY DAY    [provider]    Family History Family History  Problem Relation Age of Onset   Other Mother        pacemaker    Social History Social History   Tobacco Use   Smoking status: Never   Smokeless tobacco: Never  Vaping Use   Vaping Use: Never used  Substance Use  Topics   Alcohol use: No   Drug use: No     Allergies   Patient has no known allergies.   Review of Systems Review of Systems  Musculoskeletal:  Positive for back pain.  Neurological:  Positive for dizziness and headaches.     Physical Exam Triage Vital Signs ED Triage Vitals  Enc Vitals Group     BP 06/11/22 1143 (!) 229/98     Pulse Rate 06/11/22 1138 80     Resp 06/11/22 1138 18     Temp 06/11/22 1138 98.5 F (36.9 C)     Temp Source 06/11/22 1138 Oral     SpO2 06/11/22 1138 98 %     Weight --      Height --      Head Circumference --      Peak Flow --      Pain Score 06/11/22 1140 0     Pain Loc --      Pain Edu? --      Excl. in [place]? --    No data found.  Updated  Vital Signs BP (!) 229/98 (BP Location: Right Arm)   Pulse 80   Temp 98.5 F (36.9 C) (Oral)   Resp 18   SpO2 98%   Visual Acuity Right Eye Distance:   Left Eye Distance:   Bilateral Distance:    Right Eye Near:   Left Eye Near:    Bilateral Near:     Physical Exam Vitals reviewed.  Constitutional:      Appearance: She is well-developed. She is ill-appearing.  HENT:     Right Ear: A middle ear effusion is present. Tympanic membrane is not erythematous.     Left Ear: A middle ear effusion is present. Tympanic membrane is not erythematous.     Nose:     Right Sinus: Maxillary sinus tenderness and frontal sinus tenderness present.     Left Sinus: Maxillary sinus tenderness and frontal sinus tenderness present.  Cardiovascular:     Rate and Rhythm: Normal rate and regular rhythm.     Heart sounds: Normal heart sounds.  Pulmonary:     Effort: Pulmonary effort is normal.     Breath sounds: Normal breath sounds.  Neurological:     Mental Status: She is alert and oriented to person, place, and time.  Psychiatric:        Mood and Affect: Mood normal.        Behavior: Behavior normal.      UC Treatments / Results  Labs (all labs ordered are listed, but only abnormal results are displayed) Labs Reviewed - No data to display  EKG   Radiology No results found.  Procedures Procedures (including critical care time)  Medications Ordered in UC Medications - No data to display  Initial Impression / Assessment and Plan / UC Course  I have reviewed the triage vital signs and the nursing notes.  Pertinent labs & imaging results that were available during my care of the patient were reviewed by me and considered in my medical decision making (see chart for details).   Patient is afebrile here without recent antipyretics. Satting well on room air. Overall is ill appearing, well hydrated, without respiratory distress. Pulmonary exam is unremarkable.  Lungs CTAB without  wheezing, rhonchi, rales.  She has both frontal and maxillary sinus tenderness bilaterally.  TMs are not erythematous bilaterally.  There is middle ear effusion bilaterally.  Given duration of reported symptoms, suspect acute bacterial  sinusitis and will treat with Augmentin.  Concern for very elevated blood pressure and cautioned patient to go home immediately to take her blood pressure medications.  Report continued blood pressure symptoms to her primary care.  Will not consider prednisone given hypertensive symptoms today though I suspect it might provide relief of symptoms sooner than Augmentin alone.  In addition she has type 2 diabetes with historically high glucose levels.  Final Clinical Impressions(s) / UC Diagnoses   Final diagnoses:  None   Discharge Instructions   None    ED Prescriptions   None    PDMP not reviewed this encounter.   Rose Phi, [place] 06/11/22 1207

## 2022-08-18 ENCOUNTER — Other Ambulatory Visit: Payer: Self-pay

## 2022-08-18 ENCOUNTER — Encounter: Payer: Self-pay | Admitting: Intensive Care

## 2022-08-18 ENCOUNTER — Emergency Department: Payer: Medicare Other

## 2022-08-18 ENCOUNTER — Emergency Department
Admission: EM | Admit: 2022-08-18 | Discharge: 2022-08-18 | Disposition: A | Discharge status: Home / Self Care | Payer: Medicare Other | Attending: Emergency Medicine | Admitting: Emergency Medicine

## 2022-08-18 ENCOUNTER — Ambulatory Visit
Admission: EM | Admit: 2022-08-18 | Discharge: 2022-08-18 | Disposition: A | Discharge status: Home / Self Care | Payer: Medicare Other

## 2022-08-18 DIAGNOSIS — R42 Dizziness and giddiness: Secondary | ICD-10-CM | POA: Diagnosis not present

## 2022-08-18 DIAGNOSIS — R0602 Shortness of breath: Secondary | ICD-10-CM

## 2022-08-18 DIAGNOSIS — I10 Essential (primary) hypertension: Secondary | ICD-10-CM | POA: Insufficient documentation

## 2022-08-18 DIAGNOSIS — R519 Headache, unspecified: Secondary | ICD-10-CM | POA: Insufficient documentation

## 2022-08-18 DIAGNOSIS — I160 Hypertensive urgency: Secondary | ICD-10-CM | POA: Diagnosis not present

## 2022-08-18 DIAGNOSIS — M5441 Lumbago with sciatica, right side: Secondary | ICD-10-CM | POA: Diagnosis not present

## 2022-08-18 HISTORY — DX: Unspecified osteoarthritis, unspecified site: M19.90

## 2022-08-18 LAB — CBC WITH DIFFERENTIAL/PLATELET
Abs Immature Granulocytes: 0.01 10*3/uL (ref 0.00–0.07)
Basophils Absolute: 0 10*3/uL (ref 0.0–0.1)
Basophils Relative: 1 %
Eosinophils Absolute: 0.1 10*3/uL (ref 0.0–0.5)
Eosinophils Relative: 1 %
HCT: 48.3 % — ABNORMAL HIGH (ref 36.0–46.0)
Hemoglobin: 14.6 g/dL (ref 12.0–15.0)
Immature Granulocytes: 0 %
Lymphocytes Relative: 27 %
Lymphs Abs: 1.9 10*3/uL (ref 0.7–4.0)
MCH: 22.1 pg — ABNORMAL LOW (ref 26.0–34.0)
MCHC: 30.2 g/dL (ref 30.0–36.0)
MCV: 73.1 fL — ABNORMAL LOW (ref 80.0–100.0)
Monocytes Absolute: 0.5 10*3/uL (ref 0.1–1.0)
Monocytes Relative: 7 %
Neutro Abs: 4.5 10*3/uL (ref 1.7–7.7)
Neutrophils Relative %: 64 %
Platelets: 289 10*3/uL (ref 150–400)
RBC: 6.61 MIL/uL — ABNORMAL HIGH (ref 3.87–5.11)
RDW: 17.4 % — ABNORMAL HIGH (ref 11.5–15.5)
WBC: 6.9 10*3/uL (ref 4.0–10.5)
nRBC: 0 % (ref 0.0–0.2)

## 2022-08-18 LAB — COMPREHENSIVE METABOLIC PANEL
ALT: 18 U/L (ref 0–44)
AST: 20 U/L (ref 15–41)
Albumin: 4.3 g/dL (ref 3.5–5.0)
Alkaline Phosphatase: 108 U/L (ref 38–126)
Anion gap: 7 (ref 5–15)
BUN: 18 mg/dL (ref 8–23)
CO2: 25 mmol/L (ref 22–32)
Calcium: 9.6 mg/dL (ref 8.9–10.3)
Chloride: 108 mmol/L (ref 98–111)
Creatinine, Ser: 0.75 mg/dL (ref 0.44–1.00)
GFR, Estimated: >60 mL/min (ref >60)
Glucose, Bld: 158 mg/dL — ABNORMAL HIGH (ref 70–99)
Potassium: 4.3 mmol/L (ref 3.5–5.1)
Sodium: 140 mmol/L (ref 135–145)
Total Bilirubin: 1.3 mg/dL — ABNORMAL HIGH (ref 0.3–1.2)
Total Protein: 8.8 g/dL — ABNORMAL HIGH (ref 6.5–8.1)

## 2022-08-18 LAB — TROPONIN I (HIGH SENSITIVITY): Troponin I (High Sensitivity): 3 ng/L (ref <18)

## 2022-08-18 MED ORDER — IOHEXOL 350 MG/ML SOLN
75.0000 mL | Freq: Once | INTRAVENOUS | Status: AC | PRN
  Administered 2022-08-18: 75 mL via INTRAVENOUS

## 2022-08-18 NOTE — ED Provider Notes (Signed)
Renal[facility] Fidd[facility]    CSN: [id number] Arrival date & time: 08/18/22  1009      History   Chief Complaint Chief Complaint  Patient presents with   Shortness of Breath    HPI MAKYLIA RADOSEVICH is a 72 y.o. female.    Shortness of Breath   Dents to urgent care with multiple complaints:  - Shortness of breath starting 1 week ago.  Worse with exertion, relieved by rest. - Dizziness x 2 to 3 days.  She endorses history of vertigo but states "this feels different".  Reports head pressure, lightheadedness. - Right-sided lower back pain radiating to her right leg x 1 week.  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  Past Medical History:  Diagnosis Date   Diastolic dysfunction    a. 09/2014 Echo: EF 65%, no rwma; b. 11/2019 Echo: EF 55-60%, no rwam, GrII DD. Nl RV size/fxn. Mild AoV sclerosis w/o stenosis.   Hypertension    Paroxysmal SVT (supraventricular tachycardia)     Patient Active Problem List   Diagnosis Date Noted   Benign paroxysmal positional vertigo    Chiari malformation type I ([facility])    Impaired fasting glucose    Vertigo 09/19/2020   PSVT (paroxysmal supraventricular tachycardia) 09/10/2014   Essential hypertension 09/10/2014    Past Surgical History:  Procedure Laterality Date   ABDOMINAL HYSTERECTOMY      OB History   No obstetric history on file.      Home Medications    Prior to Admission medications   Medication Sig Start Date End Date Taking? Authorizing Provider  amoxicillin-clavulanate (AUGMENTIN) 875-125 MG tablet Take 1 tablet by mouth every 12 (twelve) hours. Patient not taking: Reported on 08/18/2022 06/11/22   Keiri Solano, Jeannett Senior, FNP  ascorbic acid (VITAMIN C) 500 MG tablet Take 500 mg by mouth daily.    [provider]  carvedilol (COREG) 3.125 MG tablet Take 1 tablet (3.125 mg total) by mouth 2 (two) times daily. Patient not taking: Reported on 08/18/2022 05/16/21 08/14/21  Swinyer, Zachary George, NP  fluticasone [facility]) 50 MCG/ACT nasal  spray Place 2 sprays into both nostrils daily as needed for allergies.    [provider]  lisinopril (ZESTRIL) 30 MG tablet Take 0.5 tablets (15 mg total) by mouth daily. 05/19/21   Creig Hines, NP  loratadine (CLARITIN) 10 MG tablet Take by mouth. 11/16/15   [provider]  metFORMIN (GLUCOPHAGE) 500 MG tablet Take 1 tablet (500 mg total) by mouth 2 (two) times daily with a meal. Patient not taking: Reported on 08/18/2022 02/11/22   Mardella Layman, MD  metoprolol tartrate (LOPRESSOR) 25 MG tablet Take 25 mg by mouth 2 (two) times daily as needed (SVT). Patient not taking: Reported on 08/18/2022    [provider]  Multiple Vitamin (MULTIVITAMIN) capsule Take 1 capsule by mouth daily.    [provider]  pantoprazole (PROTONIX) 20 MG tablet Take 1 tablet (20 mg total) by mouth 2 (two) times daily. 02/11/22   Mardella Layman, MD  rosuvastatin (CRESTOR) 10 MG tablet rosuvastatin 10 mg tablet  TAKE 1 TABLET BY MOUTH EVERY DAY Patient not taking: Reported on 08/18/2022    [provider]    Family History Family History  Problem Relation Age of Onset   Other Mother        pacemaker    Social History Social History   Tobacco Use   Smoking status: Never   Smokeless tobacco: Never  Vaping Use   Vaping  Use: Never used  Substance Use Topics   Alcohol use: No   Drug use: No     Allergies   Patient has no known allergies.   Review of Systems Review of Systems  Respiratory:  Positive for shortness of breath.      Physical Exam Triage Vital Signs ED Triage Vitals  Enc Vitals Group     BP 08/18/22 1020 (!) 200/76     Pulse Rate 08/18/22 1020 76     Resp 08/18/22 1020 19     Temp 08/18/22 1020 98 F (36.7 C)     Temp src --      SpO2 08/18/22 1020 98 %     Weight --      Height --      Head Circumference --      Peak Flow --      Pain Score 08/18/22 1018 0     Pain Loc --      Pain Edu? --      Excl. in [place]? --    No  data found.  Updated Vital Signs BP (!) 200/76   Pulse 76   Temp 98 F (36.7 C)   Resp 19   SpO2 98%   Visual Acuity Right Eye Distance:   Left Eye Distance:   Bilateral Distance:    Right Eye Near:   Left Eye Near:    Bilateral Near:     Physical Exam Vitals reviewed.  Cardiovascular:     Rate and Rhythm: Normal rate.  Pulmonary:     Effort: Pulmonary effort is normal.     Breath sounds: Normal breath sounds.  Neurological:     Mental Status: She is alert and oriented to person, place, and time.  Psychiatric:        Mood and Affect: Mood normal.        Behavior: Behavior normal.      UC Treatments / Results  Labs (all labs ordered are listed, but only abnormal results are displayed) Labs Reviewed - No data to display  EKG   Radiology No results found.  Procedures Procedures (including critical care time)  Medications Ordered in UC Medications - No data to display  Initial Impression / Assessment and Plan / UC Course  I have reviewed the triage vital signs and the nursing notes.  Pertinent labs & imaging results that were available during my care of the patient were reviewed by me and considered in my medical decision making (see chart for details).   ROSABELLE JAGERS is a 72 y.o. female presenting with SOB and dizziness.  He is found to have elevated blood pressure with wide pulse pressure, and confirms having taken her blood pressure medications over 90 minutes ago. Patient is afebrile without recent antipyretics, satting well on room air. Overall is ill appearing though non-toxic, well hydrated, without respiratory distress. Pulmonary exam is unremarkable.  Lungs CTAB without wheezing, rhonchi, rales.  RRR.  Patient is found to be hypertensive with complaint of dizziness and shortness of breath.  Patient has been seen in this clinic on multiple occasions with elevated blood pressure however on previous instances she has admitted to not taking her blood  pressure medication prior to being evaluated.  Recommended to patient that she seek immediate evaluation in the emergency room.  She has refused transport by EMS and states she is able to get herself there safely.  Final Clinical Impressions(s) / UC Diagnoses   Final diagnoses:  None  Discharge Instructions   None    ED Prescriptions   None    PDMP not reviewed this encounter.   Charma Igo, [place] 08/18/22 1035

## 2022-08-18 NOTE — ED Notes (Signed)
Pt provided discharge instructions and prescription information. Pt was given the opportunity to ask questions and questions were answered.   

## 2022-08-18 NOTE — Discharge Instructions (Addendum)
Recommend you go to the emergency room to be evaluated for your symptoms.  Follow-up with your primary care provider.

## 2022-08-18 NOTE — ED Notes (Signed)
Pt noted in bigeminy. Radial pulse rate 36 and ECG rate of 72. EDP notified.

## 2022-08-18 NOTE — ED Triage Notes (Signed)
Patient to Urgent Care with multiple complaints.  Complaints of shortness of breath that started one week ago. Reports that it is worse with exertion and relieved with rest.   Also w/ complaints of dizziness x2-3 days. Hx of vertigo but this feels different. Reports head pressure/ light headedness.  Also reports that she has arthritis in her back. States that she has been having some right sided lower back pain that radiates into her right leg. Symptoms x1 week.   Taking claritin for seasonal allergies.

## 2022-08-18 NOTE — ED Notes (Signed)
Patient is being discharged from the Urgent Care and sent to the Emergency Department via POV . Per Jeannett Senior Immordin, patient is in need of higher level of care due to dizziness/ SHOB/ HTN. Patient is aware and verbalizes understanding of plan of care.  Vitals:   08/18/22 1020  BP: (!) 200/76  Pulse: 76  Resp: 19  Temp: 98 F (36.7 C)  SpO2: 98%

## 2022-08-18 NOTE — ED Provider Notes (Signed)
[facility] Provider Note    Event Date/Time   First MD Initiated Contact with Patient 08/18/22 1440     (approximate)   History   Shortness of Breath and Sciatica   HPI  Jordan Barron is a 72 y.o. female with history of BPPV, SVT, HTN presenting to the emergency department for evaluation of shortness of breath.  Patient reports that for the last week she has had some shortness of breath worse with exertion.  She is very concerned that she might have a blood clot as she had a friend with similar symptoms.  She additionally reports dizziness for the past few days described as a lightheadedness, different than her vertigo.  Also reports intermittent back pain, occasionally radiating down her leg.  She was initially seen in urgent care.  There, she was found to have significant hypertension with a blood pressure of 200/76.  She was sent to the ER for further evaluation.  Blood pressure here improved.  Does report ongoing dizziness and headache, no numbness, tingling, focal weakness.    Physical Exam   Triage Vital Signs: ED Triage Vitals  Enc Vitals Group     BP 08/18/22 1106 (!) 129/98     Pulse Rate 08/18/22 1106 71     Resp 08/18/22 1106 20     Temp 08/18/22 1106 98.4 F (36.9 C)     Temp Source 08/18/22 1106 Oral     SpO2 08/18/22 1106 97 %     Weight 08/18/22 1114 210 lb (95.3 kg)     Height 08/18/22 1114 5\' 10"  (1.778 m)     Head Circumference --      Peak Flow --      Pain Score 08/18/22 1113 5     Pain Loc --      Pain Edu? --      Excl. in GC? --     Most recent vital signs: Vitals:   08/18/22 1710 08/18/22 1730  BP: (!) 191/80 (!) 172/72  Pulse: (!) 55 (!) 58  Resp: 19 18  Temp:    SpO2: 98% 100%     General: Awake, interactive  CV:  Regular rate, good peripheral perfusion.  Resp:  Lungs clear, unlabored respirations.  Abd:  Soft, nondistended.   Neuro:  Alert and oriented, normal extraocular movements, symmetric facial  movement, symmetric strength in bilateral upper and lower extremities with normal sensation.  Normal coordination.  ED Results / Procedures / Treatments   Labs (all labs ordered are listed, but only abnormal results are displayed) Labs Reviewed  CBC WITH DIFFERENTIAL/PLATELET - Abnormal; Notable for the following components:      Result Value   RBC 6.61 (*)    HCT 48.3 (*)    MCV 73.1 (*)    MCH 22.1 (*)    RDW 17.4 (*)    All other components within normal limits  COMPREHENSIVE METABOLIC PANEL - Abnormal; Notable for the following components:   Glucose, Bld 158 (*)    Total Protein 8.8 (*)    Total Bilirubin 1.3 (*)    All other components within normal limits  TROPONIN I (HIGH SENSITIVITY)     EKG EKG independently reviewed interpreted by myself (ER attending) demonstrates:  EKG demonstrates sinus rhythm with frequent PVCs, PR 156, QRS 118, QTc 490, patient reports a history of ectopy for which she has been followed by her outpatient providers.  RADIOLOGY Imaging independently reviewed and interpreted by myself demonstrates:  Head  CT without acute bleed Chest x-Deborra Phegley without focal pneumonia CTA chest without PE  PROCEDURES:  Critical Care performed: No  Procedures   MEDICATIONS ORDERED IN ED: Medications  iohexol (OMNIPAQUE) 350 MG/ML injection 75 mL (75 mLs Intravenous Contrast Given 08/18/22 1607)     IMPRESSION / MDM / ASSESSMENT AND PLAN / ED COURSE  I reviewed the triage vital signs and the nursing notes.  Differential diagnosis includes, but is not limited to, hypertensive emergency including intracranial bleed, cardiac strain, pulmonary edema, regarding shortness of breath possible pneumonia, pneumothorax, consideration for pulmonary embolism  Patient's presentation is most consistent with acute presentation with potential threat to life or bodily function.  72 year old female presenting to the emergency department for evaluation from urgent care with  multiple complaints with concern for hypertensive emergency.  Here, blood pressure improved, exam overall reassuring.  Patient very concerned about possible PE, discussed with D-dimer but patient strongly prefers imaging.  CTA of the chest ordered as well as a CT head to evaluate for intracranial pathology in the setting of her dizziness and hypertension.  CT imaging reassuring.  No evidence of hypertensive emergency.  Patient without new complaints on reevaluation.  She is eager to be discharged home.  Do think this is reasonable.  Discussed the importance of close outpatient follow-up.  Strict return precautions provided.     FINAL CLINICAL IMPRESSION(S) / ED DIAGNOSES   Final diagnoses:  Hypertensive urgency  Shortness of breath  Lightheadedness  Acute right-sided low back pain with right-sided sciatica     Rx / DC Orders   ED Discharge Orders     None        Note:  This document was prepared using Dragon voice recognition software and may include unintentional dictation errors.   Trinna Post, MD 08/18/22 2007

## 2022-08-18 NOTE — ED Triage Notes (Signed)
Patient presents with SOB X1 week. Worse with ambulation. Also having dizziness for a few days with head pressure and light headedness. Reports pain that starts in right side and radiates down leg.   History vertigo and arthritis

## 2022-08-18 NOTE — Discharge Instructions (Addendum)
You were seen in the emergency department today for evaluation of your lightheadedness, shortness of breath, and back pain.Your lab work, x-Gaylen Pereira, and CT scans were overall reassuring.  I suspect your back pain may be related to something known as sciatica.  You can follow-up with your primary care doctor for further evaluation of this.  Please make sure you are taking your blood pressure medication as directed.  Follow your primary care doctor within few days for reevaluation.  Return to the ER for new or worsening symptoms including chest pain, difficulty breathing, new numbness, tingling, weakness, or any other new or concerning symptoms.

## 2022-08-24 ENCOUNTER — Encounter: Payer: Self-pay | Admitting: Medical

## 2022-08-24 ENCOUNTER — Ambulatory Visit: Payer: Medicare Other | Attending: Medical | Admitting: Medical

## 2022-08-24 VITALS — BP 188/88 | HR 73 | Ht 70.0 in | Wt 221.0 lb

## 2022-08-24 DIAGNOSIS — I471 Supraventricular tachycardia, unspecified: Secondary | ICD-10-CM

## 2022-08-24 DIAGNOSIS — I493 Ventricular premature depolarization: Secondary | ICD-10-CM | POA: Diagnosis not present

## 2022-08-24 DIAGNOSIS — I10 Essential (primary) hypertension: Secondary | ICD-10-CM | POA: Insufficient documentation

## 2022-08-24 MED ORDER — LISINOPRIL 40 MG PO TABS: 40.0000 mg | ORAL_TABLET | Freq: Every day | ORAL | 3 refills | Status: AC

## 2022-08-24 MED ORDER — AMLODIPINE BESYLATE 5 MG PO TABS: 5.0000 mg | ORAL_TABLET | Freq: Every day | ORAL | 3 refills | Status: AC

## 2022-08-24 NOTE — Patient Instructions (Signed)
Medication Instructions:  Your physician has recommended you make the following change in your medication:   lisinopril (ZESTRIL) 40 MG tablet - Take 1 tablet (40 mg total) by mouth daily  amLODipine (NORVASC) 5 MG tablet - Take 1 tablet (5 mg total) by mouth daily  *If you need a refill on your cardiac medications before your next appointment, please call your pharmacy*  Lab Work: -None ordered If you have labs (blood work) drawn today and your tests are completely normal, you will receive your results only by: MyChart Message (if you have MyChart) OR A paper copy in the mail If you have any lab test that is abnormal or we need to change your treatment, we will call you to review the results.  Testing/Procedures: -None ordered  Follow-Up: At [facility], you and your health needs are our priority.  As part of our continuing mission to provide you with exceptional heart care, we have created designated Provider Care Teams.  These Care Teams include your primary Cardiologist (physician) and Advanced Practice Providers (APPs -  Physician Assistants and Nurse Practitioners) who all work together to provide you with the care you need, when you need it.  Your next appointment:   7 - 10 day(s)  Provider:   Terrilee Croak, PA-C    Other Instructions -None

## 2022-08-24 NOTE — Progress Notes (Signed)
Cardiology Office Note:    Date:  08/24/2022   ID:  Jordan Barron, DOB [birth year], MRN [id number]  PCP:  Lindaann Pascal, PA-C  [facility] Cardiologist:  Yvonne Kendall, MD  [facility] Electrophysiologist:  None   Referring MD: Lindaann Pascal, PA-C   Chief Complaint: ER follow-up  History of Present Illness:    Jordan Barron is a 72 y.o. female with a hx of paroxysmal SVT and hypertension who presents for ER follow-up.  Patient was previously evaluated by Dr. Patty Sermons for SVT in 2016 with recommendation for metoprolol and Holter monitor and echo.  Echo at that time was normal.  Holter monitor was not completed.  In 2019 the patient was seen in the ER with palpitations and shortness of breath.  EKG showed SVT and the patient was given 6 mg of IV adenosine with resolution and conversion to sinus rhythm.  This was her third episode of SVT 2016.  Echo in 2021 showed LVEF 55 to 60%, grade 2 diastolic dysfunction.  Patient was last seen in 2022 and was doing well from a cardiac perspective.  Weight was down 10 pounds.  Patient was seen in the ER found to have blood pressure 200/76. BP improved with no intervention. Labs were unremarkable. CTA chest was non-acute. CT head was negative. She was started on Coreg and discharged.   Today, the patient's BP is high 190/100. Jhonnie Garner she went to the Er she was lightheaded and had shortness of breath. The patient is going to be traveling the 27th to [place] to bury her mother. She will be gone for 12 days. She was stared on coreg, but this caused dizziness so she stopped it. Repeat BP was still high.   Past Medical History:  Diagnosis Date   Arthritis    Diastolic dysfunction    a. 09/2014 Echo: EF 65%, no rwma; b. 11/2019 Echo: EF 55-60%, no rwam, GrII DD. Nl RV size/fxn. Mild AoV sclerosis w/o stenosis.   Hypertension    Paroxysmal SVT (supraventricular tachycardia)     Past Surgical History:  Procedure Laterality Date   ABDOMINAL  HYSTERECTOMY      Current Medications: Current Meds  Medication Sig   amLODipine (NORVASC) 5 MG tablet Take 1 tablet (5 mg total) by mouth daily.   ascorbic acid (VITAMIN C) 500 MG tablet Take 500 mg by mouth daily.   aspirin EC 81 MG tablet Take 81 mg by mouth daily. Swallow whole.   carvedilol (COREG) 3.125 MG tablet Take 1 tablet (3.125 mg total) by mouth 2 (two) times daily.   fluticasone (FLONASE) 50 MCG/ACT nasal spray Place 2 sprays into both nostrils daily as needed for allergies.   loratadine (CLARITIN) 10 MG tablet Take by mouth.   Multiple Vitamin (MULTIVITAMIN) capsule Take 1 capsule by mouth daily.   pantoprazole (PROTONIX) 20 MG tablet Take 1 tablet (20 mg total) by mouth 2 (two) times daily.   [DISCONTINUED] lisinopril (ZESTRIL) 30 MG tablet Take 0.5 tablets (15 mg total) by mouth daily.     Allergies:   Patient has no known allergies.   Social History   Socioeconomic History   Marital status: Single    Spouse name: Not on file   Number of children: Not on file   Years of education: Not on file   Highest education level: Not on file  Occupational History   Not on file  Tobacco Use   Smoking status: Never   Smokeless tobacco: Never  Vaping Use  Vaping Use: Never used  Substance and Sexual Activity   Alcohol use: No   Drug use: No   Sexual activity: Not on file  Other Topics Concern   Not on file  Social History Narrative   Not on file   Social Determinants of Health   Financial Resource Strain: Not on file  Food Insecurity: Not on file  Transportation Needs: Not on file  Physical Activity: Not on file  Stress: Not on file  Social Connections: Not on file     Family History: The patient's family history includes Other in her mother.  ROS:   Please see the history of present illness.     All other systems reviewed and are negative.  EKGs/Labs/Other Studies Reviewed:    The following studies were reviewed today:  Echo 05/2021 1. Left  ventricular ejection fraction, by estimation, is 60 to 65%. The  left ventricle has normal function. The left ventricle has no regional  wall motion abnormalities. Left ventricular diastolic parameters are  consistent with Grade I diastolic  dysfunction (impaired relaxation).   2. Right ventricular systolic function is normal. The right ventricular  size is normal. Tricuspid regurgitation signal is inadequate for assessing  PA pressure.   3. Left atrial size was mildly dilated.   4. The mitral valve is normal in structure. Mild mitral valve  regurgitation. No evidence of mitral stenosis.   5. The aortic valve is normal in structure. Aortic valve regurgitation is  not visualized. Aortic valve sclerosis is present, with no evidence of  aortic valve stenosis.   6. The inferior vena cava is normal in size with greater than 50%  respiratory variability, suggesting right atrial pressure of 3 mmHg.   7. PVCs noted   EKG:  EKG is ordered today.  The ekg ordered today demonstrates normal sinus rhythm, 73 bpm, frequent PVCs, left anterior fascicular block, LVH with widening QRS of 120 ms, nonspecific ST T wave changes  Recent Labs: 02/11/2022: TSH 2.239 08/18/2022: ALT 18; BUN 18; Creatinine, Ser 0.75; Hemoglobin 14.6; Platelets 289; Potassium 4.3; Sodium 140  Recent Lipid Panel No results found for: "CHOL", "TRIG", "HDL", "CHOLHDL", "VLDL", "LDLCALC", "LDLDIRECT"    Physical Exam:    VS:  BP (!) 188/88   Pulse 73   Ht 5\' 10"  (1.778 m)   Wt 221 lb (100.2 kg)   SpO2 99%   BMI 31.71 kg/m     Wt Readings from Last 3 Encounters:  08/24/22 221 lb (100.2 kg)  08/18/22 210 lb (95.3 kg)  08/06/21 220 lb (99.8 kg)     GEN:  Well nourished, well developed in no acute distress HEENT: Normal NECK: No JVD; No carotid bruits LYMPHATICS: No lymphadenopathy CARDIAC: RRR, no murmurs, rubs, gallops RESPIRATORY:  Clear to auscultation without rales, wheezing or rhonchi  ABDOMEN: Soft, non-tender,  non-distended MUSCULOSKELETAL:  No edema; No deformity  SKIN: Warm and dry NEUROLOGIC:  Alert and oriented x 3 PSYCHIATRIC:  Normal affect   ASSESSMENT:    1. Essential hypertension   2. PSVT (paroxysmal supraventricular tachycardia)   3. Frequent PVCs    PLAN:    In order of problems listed above:  HTN BP severely elevated today 190/100, also on repeat check. Patient is asymptomatic. She does have occasional lightheadedness, which suspect is from high blood pressure. She was seen in the ER recently for hypertensive urgency and started on Coreg. She said Coreg made her feel dizzy, so she stopped this. Labs in the ER were  unremarkable. She does not check blood pressure at home.  I will increase lisinopril to 40 mg daily and start amlodipine 5 mg daily.  Echo in 2023 showed normal LVEF, grade 1 diastolic dysfunction, mild MR.  Will update an echocardiogram today.  Patient is traveling to [place] on the 27th of this month to marry her mother.  She will be gone for 12 days.  I will see her back prior to traveling.  Frequent PVCs pSVT It appears frequent PVCs have been a long-standing issue. Notes suggest she has been on metoprolol and Coreg in the past, however she did not tolerate these medications very well.  EKG today shows sinus rhythm with frequent PVCs with a heart rate of 73 bpm.  We can consider 2-week heart monitor once patient comes back from [place]. May re-trial Coreg.   Disposition: Follow up in 1 week(s) with MD/APP    Signed, Meliah Appleman David Stall, PA-C  08/24/2022 10:10 AM    [facility]

## 2022-08-31 ENCOUNTER — Ambulatory Visit: Payer: Medicare Other | Attending: Medical | Admitting: Medical

## 2022-08-31 ENCOUNTER — Encounter: Payer: Self-pay | Admitting: Medical

## 2022-08-31 VITALS — BP 177/75 | HR 72 | Ht 70.0 in | Wt 220.0 lb

## 2022-08-31 DIAGNOSIS — I493 Ventricular premature depolarization: Secondary | ICD-10-CM | POA: Insufficient documentation

## 2022-08-31 DIAGNOSIS — I10 Essential (primary) hypertension: Secondary | ICD-10-CM | POA: Insufficient documentation

## 2022-08-31 DIAGNOSIS — I471 Supraventricular tachycardia, unspecified: Secondary | ICD-10-CM | POA: Diagnosis not present

## 2022-08-31 NOTE — Patient Instructions (Signed)
Medication Instructions:   Your physician recommends that you continue on your current medications as directed. Please refer to the Current Medication list given to you today.  *If you need a refill on your cardiac medications before your next appointment, please call your pharmacy*   Lab Work:  None Ordered  If you have labs (blood work) drawn today and your tests are completely normal, you will receive your results only by: MyChart Message (if you have MyChart) OR A paper copy in the mail If you have any lab test that is abnormal or we need to change your treatment, we will call you to review the results.   Testing/Procedures:  None ordered    Follow-Up: At [facility], you and your health needs are our priority.  As part of our continuing mission to provide you with exceptional heart care, we have created designated Provider Care Teams.  These Care Teams include your primary Cardiologist (physician) and Advanced Practice Providers (APPs -  Physician Assistants and Nurse Practitioners) who all work together to provide you with the care you need, when you need it.  We recommend signing up for the patient portal called "MyChart".  Sign up information is provided on this After Visit Summary.  MyChart is used to connect with patients for Virtual Visits (Telemedicine).  Patients are able to view lab/test results, encounter notes, upcoming appointments, etc.  Non-urgent messages can be sent to your provider as well.   To learn more about what you can do with MyChart, go to [email or web address].    Your next appointment:    After [facility]   Provider:   You may see Yvonne Kendall, MD or one of the following Advanced Practice Providers on your designated Care Team:   Nicolasa Ducking, NP Eula Listen, PA-C Cadence Fransico Michael, PA-C Charlsie Quest, NP

## 2022-08-31 NOTE — Progress Notes (Signed)
Cardiology Office Note:    Date:  08/31/2022   ID:  Jordan Barron, DOB 04-23-1950, MRN [id number]  PCP:  Lindaann Pascal, PA-C  [facility] Cardiologist:  Yvonne Kendall, MD  [facility] Electrophysiologist:  None   Referring MD: Lindaann Pascal, PA-C   Chief Complaint: 2 week follow-up  History of Present Illness:    Jordan Barron is a 72 y.o. female with a hx of paroxysmal SVT and hypertension who presents for 2 week follow-up.   Patient was previously evaluated by Dr. Patty Sermons for SVT in 2016 with recommendation for metoprolol and Holter monitor and echo.  Echo at that time was normal.  Holter monitor was not completed.   In 2019 the patient was seen in the ER with palpitations and shortness of breath.  EKG showed SVT and the patient was given 6 mg of IV adenosine with resolution and conversion to sinus rhythm.  This was her third episode of SVT 2016.   Echo in 2021 showed LVEF 55 to 60%, grade 2 diastolic dysfunction.   Patient was seen in the ER found to have blood pressure 200/76. BP improved with no intervention. Labs were unremarkable. CTA chest was non-acute. CT head was negative. She was started on Coreg and discharged.   Patient was seen in ER follow-up 08/24/2022 with a blood pressure 190/100.  Patient was planning to travel later that month.  Patient reported Coreg make her dizzy, so she stopped this.  Lisinopril was increased to 40 and she was started on amlodipine 5 mg daily.  Today, the patient reports she did not tolerate the amlodipine due to weakness and tiredness, like she was going to pass out. She took it for 2 days and stopped it. She is taking lisinopril 40mg  daily.   Past Medical History:  Diagnosis Date   Arthritis    Diastolic dysfunction    a. 09/2014 Echo: EF 65%, no rwma; b. 11/2019 Echo: EF 55-60%, no rwam, GrII DD. Nl RV size/fxn. Mild AoV sclerosis w/o stenosis.   Hypertension    Paroxysmal SVT (supraventricular tachycardia)     Past Surgical  History:  Procedure Laterality Date   ABDOMINAL HYSTERECTOMY      Current Medications: Current Meds  Medication Sig   amLODipine (NORVASC) 5 MG tablet Take 1 tablet (5 mg total) by mouth daily.   ascorbic acid (VITAMIN C) 500 MG tablet Take 500 mg by mouth daily.   aspirin EC 81 MG tablet Take 81 mg by mouth daily. Swallow whole.   lisinopril (ZESTRIL) 40 MG tablet Take 1 tablet (40 mg total) by mouth daily.   loratadine (CLARITIN) 10 MG tablet Take 10 mg by mouth as needed.   Multiple Vitamin (MULTIVITAMIN) capsule Take 1 capsule by mouth daily.   pantoprazole (PROTONIX) 20 MG tablet Take 1 tablet (20 mg total) by mouth 2 (two) times daily.     Allergies:   Patient has no known allergies.   Social History   Socioeconomic History   Marital status: Single    Spouse name: Not on file   Number of children: Not on file   Years of education: Not on file   Highest education level: Not on file  Occupational History   Not on file  Tobacco Use   Smoking status: Never   Smokeless tobacco: Never  Vaping Use   Vaping Use: Never used  Substance and Sexual Activity   Alcohol use: No   Drug use: No   Sexual activity: Not on  file  Other Topics Concern   Not on file  Social History Narrative   Not on file   Social Determinants of Health   Financial Resource Strain: Not on file  Food Insecurity: Not on file  Transportation Needs: Not on file  Physical Activity: Not on file  Stress: Not on file  Social Connections: Not on file     Family History: The patient's family history includes Other in her mother.  ROS:   Please see the history of present illness.     All other systems reviewed and are negative.  EKGs/Labs/Other Studies Reviewed:    The following studies were reviewed today:  Echo 05/2021  1. Left ventricular ejection fraction, by estimation, is 60 to 65%. The  left ventricle has normal function. The left ventricle has no regional  wall motion abnormalities. Left  ventricular diastolic parameters are  consistent with Grade I diastolic  dysfunction (impaired relaxation).   2. Right ventricular systolic function is normal. The right ventricular  size is normal. Tricuspid regurgitation signal is inadequate for assessing  PA pressure.   3. Left atrial size was mildly dilated.   4. The mitral valve is normal in structure. Mild mitral valve  regurgitation. No evidence of mitral stenosis.   5. The aortic valve is normal in structure. Aortic valve regurgitation is  not visualized. Aortic valve sclerosis is present, with no evidence of  aortic valve stenosis.   6. The inferior vena cava is normal in size with greater than 50%  respiratory variability, suggesting right atrial pressure of 3 mmHg.   7. PVCs noted   Echo 05/2019 1. Left ventricular ejection fraction, by estimation, is 55 to 60%. The  left ventricle has normal function. The left ventricle has no regional  wall motion abnormalities. Left ventricular diastolic parameters are  consistent with Grade II diastolic  dysfunction (pseudonormalization).   2. Right ventricular systolic function is normal. The right ventricular  size is normal.   3. The mitral valve is normal in structure. No evidence of mitral valve  regurgitation. No evidence of mitral stenosis.   4. The aortic valve is tricuspid. Aortic valve regurgitation is not  visualized. Mild aortic valve sclerosis is present, with no evidence of  aortic valve stenosis.   5. The inferior vena cava is normal in size with greater than 50%  respiratory variability, suggesting right atrial pressure of 3 mmHg.   EKG:  EKG is not ordered today.     Recent Labs: 02/11/2022: TSH 2.239 08/18/2022: ALT 18; BUN 18; Creatinine, Ser 0.75; Hemoglobin 14.6; Platelets 289; Potassium 4.3; Sodium 140  Recent Lipid Panel No results found for: "CHOL", "TRIG", "HDL", "CHOLHDL", "VLDL", "LDLCALC", "LDLDIRECT"   Physical Exam:    VS:  BP (!) 177/75 (BP  Location: Right Arm, Patient Position: Sitting, Cuff Size: Normal)   Pulse 72   Ht 5\' 10"  (1.778 m)   Wt 220 lb (99.8 kg)   SpO2 98%   BMI 31.57 kg/m     Wt Readings from Last 3 Encounters:  08/31/22 220 lb (99.8 kg)  08/24/22 221 lb (100.2 kg)  08/18/22 210 lb (95.3 kg)     GEN:  Well nourished, well developed in no acute distress HEENT: Normal NECK: No JVD; No carotid bruits LYMPHATICS: No lymphadenopathy CARDIAC: RRR, no murmurs, rubs, gallops RESPIRATORY:  Clear to auscultation without rales, wheezing or rhonchi  ABDOMEN: Soft, non-tender, non-distended MUSCULOSKELETAL:  No edema; No deformity  SKIN: Warm and dry NEUROLOGIC:  Alert  and oriented x 3 PSYCHIATRIC:  Normal affect   ASSESSMENT:    1. Essential hypertension   2. Frequent PVCs   3. PSVT (paroxysmal supraventricular tachycardia)    PLAN:    In order of problems listed above:  HTN Patient did not tolerate amlodipine due to weakness and tiredness and stopped it after 2 days. She is taking lisinopril 40mg  daily. She did not tolerate Coreg or metoprolol in the past. BP is still very high today, 177/75. She is overall asymptomatic. She is traveling later this month and is not wanting to start another medication. We will see her when she gets and continue BP control.   Frequent PVCs pSVT EKG at the prior visit showed frequent PVCs. Can consider heart monitor at follow-up. Patient did not tolerate metoprolol or Coreg in the past. Can possible trial CCB.   Disposition: Follow up in 1 month(s) with MD/APP    Signed, Keeghan Bialy David Stall, PA-C  08/31/2022 12:30 PM    [facility]

## 2023-05-12 ENCOUNTER — Encounter (HOSPITAL_COMMUNITY): Payer: Self-pay

## 2023-05-12 ENCOUNTER — Other Ambulatory Visit: Payer: Self-pay

## 2023-05-12 ENCOUNTER — Emergency Department (HOSPITAL_COMMUNITY)
Admission: EM | Admit: 2023-05-12 | Discharge: 2023-05-13 | Disposition: A | Discharge status: Home / Self Care | Payer: Medicare Other | Attending: Emergency Medicine | Admitting: Emergency Medicine

## 2023-05-12 ENCOUNTER — Emergency Department (HOSPITAL_COMMUNITY): Payer: Medicare Other

## 2023-05-12 DIAGNOSIS — R42 Dizziness and giddiness: Secondary | ICD-10-CM | POA: Diagnosis not present

## 2023-05-12 DIAGNOSIS — Z79899 Other long term (current) drug therapy: Secondary | ICD-10-CM | POA: Diagnosis not present

## 2023-05-12 DIAGNOSIS — R079 Chest pain, unspecified: Secondary | ICD-10-CM | POA: Insufficient documentation

## 2023-05-12 DIAGNOSIS — I10 Essential (primary) hypertension: Secondary | ICD-10-CM | POA: Diagnosis not present

## 2023-05-12 DIAGNOSIS — Z7982 Long term (current) use of aspirin: Secondary | ICD-10-CM | POA: Diagnosis not present

## 2023-05-12 LAB — BASIC METABOLIC PANEL
Anion gap: 12 (ref 5–15)
BUN: 14 mg/dL (ref 8–23)
CO2: 20 mmol/L — ABNORMAL LOW (ref 22–32)
Calcium: 10.3 mg/dL (ref 8.9–10.3)
Chloride: 106 mmol/L (ref 98–111)
Creatinine, Ser: 0.61 mg/dL (ref 0.44–1.00)
GFR, Estimated: >60 mL/min (ref >60)
Glucose, Bld: 113 mg/dL — ABNORMAL HIGH (ref 70–99)
Potassium: 3.9 mmol/L (ref 3.5–5.1)
Sodium: 138 mmol/L (ref 135–145)

## 2023-05-12 LAB — CBC
HCT: 46.5 % — ABNORMAL HIGH (ref 36.0–46.0)
Hemoglobin: 14 g/dL (ref 12.0–15.0)
MCH: 22 pg — ABNORMAL LOW (ref 26.0–34.0)
MCHC: 30.1 g/dL (ref 30.0–36.0)
MCV: 73.2 fL — ABNORMAL LOW (ref 80.0–100.0)
Platelets: 326 10*3/uL (ref 150–400)
RBC: 6.35 MIL/uL — ABNORMAL HIGH (ref 3.87–5.11)
RDW: 17.4 % — ABNORMAL HIGH (ref 11.5–15.5)
WBC: 8.8 10*3/uL (ref 4.0–10.5)
nRBC: 0 % (ref 0.0–0.2)

## 2023-05-12 LAB — TROPONIN I (HIGH SENSITIVITY)
Troponin I (High Sensitivity): 4 ng/L (ref <18)
Troponin I (High Sensitivity): 5 ng/L (ref <18)

## 2023-05-12 NOTE — ED Provider Triage Note (Signed)
Emergency Medicine Provider Triage Evaluation Note  Jordan Barron , a 73 y.o. female  was evaluated in triage.  Pt complains of chest pain, dizziness. States she has been persistently dizzy for the past few months.  She attributes this to her vertigo that she has been diagnosed with previously.  States she is only dizzy when she gets up and walks around.  She is able to walk around without assistance.  Notes that in the last few days she has had some chest pain as well.  Presents for same.  Does note she did not take her blood pressure medication this morning because she was too concerned to do so.  Denies headaches or vision changes.  Review of Systems  Positive:  Negative:   Physical Exam  BP (!) 233/93   Pulse 81   Temp 97.9 F (36.6 C) (Oral)   Resp 18   Ht 5\' 10"  (1.778 m)   Wt 99.8 kg   SpO2 100%   BMI 31.57 kg/m  Gen:   Awake, no distress   Resp:  Normal effort  MSK:   Moves extremities without difficulty  Other:  Alert and oriented, ambulatory, no neurologic deficits  Medical Decision Making  Medically screening exam initiated at 5:50 PM.  Appropriate orders placed.  TASMINE HIPWELL was informed that the remainder of the evaluation will be completed by another provider, this initial triage assessment does not replace that evaluation, and the importance of remaining in the ED until their evaluation is complete.     Silva Bandy, PA-C 05/12/23 1755

## 2023-05-12 NOTE — ED Provider Notes (Signed)
[facility] EMERGENCY DEPARTMENT AT [facility] Provider Note   CSN: [id number] Arrival date & time: 05/12/23  1636     History {Add pertinent medical, surgical, social history, OB history to HPI:1} Chief Complaint  Patient presents with   Chest Pain   Dizziness    Jordan Barron is a 73 y.o. female.  Patient with past medical history significant for PSVT, hypertension, vertigo presents the emergency department complaining of some lightheaded/dizziness and left-sided chest pain.  Patient states that her chest pain has been ongoing for the past few hours and is currently resolved.  She states that the lightheadedness/dizziness is positional, improving with lying down, and has been ongoing for a few months.  She denies shortness of breath, abdominal pain, nausea, vomiting, fever, urinary symptoms.   Chest Pain Associated symptoms: dizziness   Dizziness Associated symptoms: chest pain        Home Medications Prior to Admission medications   Medication Sig Start Date End Date Taking? Authorizing Provider  amLODipine (NORVASC) 5 MG tablet Take 1 tablet (5 mg total) by mouth daily. 08/24/22 11/22/22  Furth, Cadence H, PA-C  ascorbic acid (VITAMIN C) 500 MG tablet Take 500 mg by mouth daily.    [provider]  aspirin EC 81 MG tablet Take 81 mg by mouth daily. Swallow whole.    [provider]  carvedilol (COREG) 3.125 MG tablet Take 1 tablet (3.125 mg total) by mouth 2 (two) times daily. Patient not taking: Reported on 08/31/2022 05/16/21   Swinyer, Zachary George, NP  fluticasone [facility]) 50 MCG/ACT nasal spray Place 2 sprays into both nostrils daily as needed for allergies. Patient not taking: Reported on 08/31/2022    [provider]  lisinopril (ZESTRIL) 40 MG tablet Take 1 tablet (40 mg total) by mouth daily. 08/24/22   Furth, Cadence H, PA-C  loratadine (CLARITIN) 10 MG tablet Take 10 mg by mouth as needed. 11/16/15   [provider]  Multiple  Vitamin (MULTIVITAMIN) capsule Take 1 capsule by mouth daily.    [provider]  pantoprazole (PROTONIX) 20 MG tablet Take 1 tablet (20 mg total) by mouth 2 (two) times daily. 02/11/22   Mardella Layman, MD      Allergies    Patient has no known allergies.    Review of Systems   Review of Systems  Cardiovascular:  Positive for chest pain.  Neurological:  Positive for dizziness.    Physical Exam Updated Vital Signs BP (!) 196/76 (BP Location: Right Arm)   Pulse (!) 58   Temp 98.3 F (36.8 C)   Resp 18   Ht 5\' 10"  (1.778 m)   Wt 99.8 kg   SpO2 100%   BMI 31.57 kg/m  Physical Exam Vitals and nursing note reviewed.  Constitutional:      General: She is not in acute distress.    Appearance: She is well-developed.  HENT:     Head: Normocephalic and atraumatic.  Eyes:     Extraocular Movements: Extraocular movements intact.     Conjunctiva/sclera: Conjunctivae normal.     Pupils: Pupils are equal, round, and reactive to light.  Cardiovascular:     Rate and Rhythm: Normal rate and regular rhythm.  Pulmonary:     Effort: Pulmonary effort is normal. No respiratory distress.     Breath sounds: Normal breath sounds.  Chest:     Chest wall: No tenderness.  Abdominal:     Palpations: Abdomen is soft.  Tenderness: There is no abdominal tenderness.  Musculoskeletal:        General: No swelling.     Cervical back: Neck supple.  Skin:    General: Skin is warm and dry.     Capillary Refill: Capillary refill takes less than 2 seconds.  Neurological:     General: No focal deficit present.     Mental Status: She is alert.     Comments: Cranial nerves II through VII, XI, XII intact  Psychiatric:        Mood and Affect: Mood normal.     ED Results / Procedures / Treatments   Labs (all labs ordered are listed, but only abnormal results are displayed) Labs Reviewed  BASIC METABOLIC PANEL - Abnormal; Notable for the following components:      Result Value   CO2 20  (*)    Glucose, Bld 113 (*)    All other components within normal limits  CBC - Abnormal; Notable for the following components:   RBC 6.35 (*)    HCT 46.5 (*)    MCV 73.2 (*)    MCH 22.0 (*)    RDW 17.4 (*)    All other components within normal limits  TROPONIN I (HIGH SENSITIVITY)  TROPONIN I (HIGH SENSITIVITY)    EKG None  Radiology DG Chest 2 View Result Date: 05/12/2023 CLINICAL DATA:  Chest pain radiating to the left side. EXAM: CHEST - 2 VIEW COMPARISON:  08/18/2022 and CT chest 08/18/2022. FINDINGS: Trachea is midline. Heart size stable. Lungs are clear. No pleural fluid. IMPRESSION: No acute findings. Electronically Signed   By: Leanna Battles M.D.   On: 05/12/2023 18:22    Procedures Procedures  {Document cardiac monitor, telemetry assessment procedure when appropriate:1}  Medications Ordered in ED Medications - No data to display  ED Course/ Medical Decision Making/ A&P   {   Click here for ABCD2, HEART and other calculatorsREFRESH Note before signing :1}                              Medical Decision Making Amount and/or Complexity of Data Reviewed Labs: ordered. Radiology: ordered.   This patient presents to the ED for concern of lightheadedness and chest pain, this involves an extensive number of treatment options, and is a complaint that carries with it a high risk of complications and morbidity.  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  The pertinent results include: Initial troponin 4, repeat 5; grossly unremarkable BMP and CBC   Imaging Studies ordered:  I ordered imaging studies including chest  x-ray and CT head without contrast I independently visualized and interpreted imaging which showed no acute findings on chest x-ray I agree with the radiologist interpretation   Cardiac Monitoring: / EKG:  The patient was maintained on a cardiac monitor.  I personally viewed and interpreted the cardiac monitored which showed an underlying rhythm of: Sinus rhythm   Consultations Obtained:  I requested consultation with the ***,  and discussed lab and imaging findings as well as pertinent plan - they recommend: ***   Problem List / ED Course / Critical interventions / Medication management  I ordered medication including ***  for ***  Reevaluation of the patient after these medicines showed that the patient {resolved/improved/worsened:23923::"improved"} I have reviewed the patients home medicines and have made adjustments as needed   Test / Admission - Considered:  ***   {Document critical care time when appropriate:1} {Document review of labs and clinical decision tools ie heart score, Chads2Vasc2 etc:1}  {Document your independent review of radiology images, and any outside records:1} {Document your discussion with family members, caretakers, and with consultants:1} {Document social determinants of health affecting pt's care:1} {Document your decision making why or why not admission, treatments were needed:1} Final Clinical Impression(s) / ED Diagnoses Final diagnoses:  None    Rx / DC Orders ED Discharge Orders     None

## 2023-05-12 NOTE — ED Triage Notes (Signed)
Pt c.o intermittent left sided chest pain, dizziness and sob. Denies pain at this time.

## 2023-05-13 NOTE — Discharge Instructions (Addendum)
Your workup tonight was reassuring.  There were no acute findings on your imaging or lab work.  I recommend following up with neurology for further evaluation of your intermittent dizziness.  I have placed a referral. You may also touch base with cardiology to discuss your recent chest pain.  If you develop worsening chest pain, shortness of breath, or other life-threatening symptoms please return to the emergency department

## 2023-08-17 ENCOUNTER — Other Ambulatory Visit: Payer: Self-pay | Admitting: Medical
